# Patient Record
Sex: Female | Born: 1980 | Race: White | Hispanic: No | Marital: Married | Smoking: Never smoker
Health system: Southern US, Community
[De-identification: ages and names within clinical notes are randomized; demographics above are authoritative.]

## PROBLEM LIST (undated history)

## (undated) ENCOUNTER — Inpatient Hospital Stay: Payer: Self-pay

## (undated) DIAGNOSIS — K644 Residual hemorrhoidal skin tags: Secondary | ICD-10-CM

## (undated) DIAGNOSIS — D72819 Decreased white blood cell count, unspecified: Secondary | ICD-10-CM

## (undated) DIAGNOSIS — R011 Cardiac murmur, unspecified: Secondary | ICD-10-CM

## (undated) HISTORY — DX: Cardiac murmur, unspecified: R01.1

## (undated) HISTORY — DX: Decreased white blood cell count, unspecified: D72.819

## (undated) HISTORY — DX: Residual hemorrhoidal skin tags: K64.4

## (undated) HISTORY — PX: WISDOM TOOTH EXTRACTION: SHX21

---
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
## 2005-02-18 ENCOUNTER — Ambulatory Visit: Payer: Self-pay

## 2006-02-19 ENCOUNTER — Ambulatory Visit: Payer: Self-pay | Admitting: Internal Medicine

## 2006-03-10 ENCOUNTER — Ambulatory Visit: Payer: Self-pay | Admitting: Internal Medicine

## 2011-03-27 ENCOUNTER — Ambulatory Visit: Payer: Self-pay | Admitting: Internal Medicine

## 2014-07-10 LAB — OB RESULTS CONSOLE ABO/RH: RH Type: POSITIVE

## 2014-07-10 LAB — OB RESULTS CONSOLE RPR: RPR: NONREACTIVE

## 2014-07-10 LAB — OB RESULTS CONSOLE GC/CHLAMYDIA
CHLAMYDIA, DNA PROBE: NEGATIVE
Gonorrhea: NEGATIVE

## 2014-07-10 LAB — OB RESULTS CONSOLE VARICELLA ZOSTER ANTIBODY, IGG: Varicella: IMMUNE

## 2014-07-10 LAB — OB RESULTS CONSOLE ANTIBODY SCREEN: Antibody Screen: NEGATIVE

## 2014-07-10 LAB — OB RESULTS CONSOLE RUBELLA ANTIBODY, IGM: Rubella: IMMUNE

## 2014-07-10 LAB — OB RESULTS CONSOLE HIV ANTIBODY (ROUTINE TESTING): HIV: NONREACTIVE

## 2014-07-10 LAB — OB RESULTS CONSOLE HEPATITIS B SURFACE ANTIGEN: HEP B S AG: NEGATIVE

## 2014-07-30 ENCOUNTER — Encounter: Payer: Self-pay | Admitting: Maternal & Fetal Medicine

## 2015-01-10 LAB — OB RESULTS CONSOLE GBS: GBS: POSITIVE

## 2015-02-01 ENCOUNTER — Other Ambulatory Visit: Payer: Self-pay | Admitting: Obstetrics and Gynecology

## 2015-02-01 ENCOUNTER — Encounter: Payer: Self-pay | Admitting: Obstetrics and Gynecology

## 2015-02-03 ENCOUNTER — Inpatient Hospital Stay
Admission: EM | Admit: 2015-02-03 | Discharge: 2015-02-08 | DRG: 766 | Disposition: A | Discharge status: Home / Self Care | Payer: BLUE CROSS/BLUE SHIELD | Attending: Obstetrics and Gynecology | Admitting: Obstetrics and Gynecology

## 2015-02-03 DIAGNOSIS — Z3A40 40 weeks gestation of pregnancy: Secondary | ICD-10-CM | POA: Diagnosis present

## 2015-02-03 DIAGNOSIS — O99820 Streptococcus B carrier state complicating pregnancy: Secondary | ICD-10-CM | POA: Diagnosis present

## 2015-02-03 DIAGNOSIS — K644 Residual hemorrhoidal skin tags: Secondary | ICD-10-CM | POA: Diagnosis present

## 2015-02-03 DIAGNOSIS — F419 Anxiety disorder, unspecified: Secondary | ICD-10-CM | POA: Diagnosis present

## 2015-02-03 DIAGNOSIS — N8320 Unspecified ovarian cysts: Secondary | ICD-10-CM | POA: Diagnosis present

## 2015-02-03 DIAGNOSIS — O99344 Other mental disorders complicating childbirth: Secondary | ICD-10-CM | POA: Diagnosis present

## 2015-02-03 DIAGNOSIS — R011 Cardiac murmur, unspecified: Secondary | ICD-10-CM | POA: Diagnosis present

## 2015-02-03 DIAGNOSIS — O133 Gestational [pregnancy-induced] hypertension without significant proteinuria, third trimester: Principal | ICD-10-CM | POA: Diagnosis present

## 2015-02-03 DIAGNOSIS — Z9889 Other specified postprocedural states: Secondary | ICD-10-CM

## 2015-02-03 DIAGNOSIS — Z3490 Encounter for supervision of normal pregnancy, unspecified, unspecified trimester: Secondary | ICD-10-CM

## 2015-02-03 DIAGNOSIS — D72819 Decreased white blood cell count, unspecified: Secondary | ICD-10-CM | POA: Diagnosis present

## 2015-02-03 LAB — CBC
HEMATOCRIT: 36.5 % (ref 35.0–47.0)
HEMOGLOBIN: 12.7 g/dL (ref 12.0–16.0)
MCH: 31.8 pg (ref 26.0–34.0)
MCHC: 34.6 g/dL (ref 32.0–36.0)
MCV: 91.6 fL (ref 80.0–100.0)
PLATELETS: 235 10*3/uL (ref 150–440)
RBC: 3.99 MIL/uL (ref 3.80–5.20)
RDW: 13.4 % (ref 11.5–14.5)
WBC: 8 10*3/uL (ref 3.6–11.0)

## 2015-02-03 LAB — TYPE AND SCREEN
ABO/RH(D): O POS
Antibody Screen: NEGATIVE

## 2015-02-03 MED ORDER — LIDOCAINE HCL (PF) 1 % IJ SOLN
30.0000 mL | INTRAMUSCULAR | Status: DC | PRN
  Filled 2015-02-03: qty 30

## 2015-02-03 MED ORDER — DINOPROSTONE 10 MG VA INST
10.0000 mg | VAGINAL_INSERT | Freq: Once | VAGINAL | Status: AC
  Administered 2015-02-03: 10 mg via VAGINAL
  Filled 2015-02-03: qty 1

## 2015-02-03 MED ORDER — LACTATED RINGERS IV SOLN
500.0000 mL | INTRAVENOUS | Status: DC | PRN
  Administered 2015-02-05: 07:00:00 via INTRAVENOUS

## 2015-02-03 MED ORDER — OXYTOCIN BOLUS FROM INFUSION: 500.0000 mL | INTRAVENOUS | Status: DC

## 2015-02-03 MED ORDER — ACETAMINOPHEN 325 MG PO TABS
650.0000 mg | ORAL_TABLET | ORAL | Status: DC | PRN
  Administered 2015-02-04 (×2): 650 mg via ORAL

## 2015-02-03 MED ORDER — OXYCODONE-ACETAMINOPHEN 5-325 MG PO TABS
1.0000 | ORAL_TABLET | ORAL | Status: DC | PRN
Start: 2015-02-03 — End: 2015-02-05

## 2015-02-03 MED ORDER — OXYCODONE-ACETAMINOPHEN 5-325 MG PO TABS: 2.0000 | ORAL_TABLET | ORAL | Status: DC | PRN

## 2015-02-03 MED ORDER — CITRIC ACID-SODIUM CITRATE 334-500 MG/5ML PO SOLN: 30.0000 mL | ORAL | Status: DC | PRN

## 2015-02-03 MED ORDER — LACTATED RINGERS IV SOLN
INTRAVENOUS | Status: DC
  Administered 2015-02-04: 125 mL/h via INTRAVENOUS
  Administered 2015-02-04 – 2015-02-05 (×2): via INTRAVENOUS

## 2015-02-03 MED ORDER — ONDANSETRON HCL 4 MG/2ML IJ SOLN
4.0000 mg | Freq: Four times a day (QID) | INTRAMUSCULAR | Status: DC | PRN
  Administered 2015-02-05: 4 mg via INTRAVENOUS

## 2015-02-03 MED ORDER — OXYTOCIN 40 UNITS IN LACTATED RINGERS INFUSION - SIMPLE MED
62.5000 mL/h | INTRAVENOUS | Status: DC
  Administered 2015-02-05: 1000 mL via INTRAVENOUS

## 2015-02-04 ENCOUNTER — Inpatient Hospital Stay: Payer: BLUE CROSS/BLUE SHIELD | Admitting: Anesthesiology

## 2015-02-04 LAB — PROTEIN / CREATININE RATIO, URINE
CREATININE, URINE: 51 mg/dL
Protein Creatinine Ratio: 0.33 mg/mg{Cre} — ABNORMAL HIGH (ref 0.00–0.15)
TOTAL PROTEIN, URINE: 17 mg/dL

## 2015-02-04 LAB — ABO/RH: ABO/RH(D): O POS

## 2015-02-04 MED ORDER — OXYTOCIN 40 UNITS IN LACTATED RINGERS INFUSION - SIMPLE MED
1.0000 m[IU]/min | INTRAVENOUS | Status: DC
  Administered 2015-02-04: 22 m[IU]/min via INTRAVENOUS

## 2015-02-04 MED ORDER — SODIUM CHLORIDE 0.9 % IV SOLN
1.0000 g | INTRAVENOUS | Status: DC
  Administered 2015-02-04: 1 g via INTRAVENOUS
  Administered 2015-02-04: 18:00:00 via INTRAVENOUS
  Administered 2015-02-04: 1 g via INTRAVENOUS
  Administered 2015-02-04: 14:00:00 via INTRAVENOUS
  Administered 2015-02-05: 1 g via INTRAVENOUS
  Filled 2015-02-04 (×7): qty 1000

## 2015-02-04 MED ORDER — ACETAMINOPHEN 325 MG PO TABS
ORAL_TABLET | ORAL | Status: AC
  Administered 2015-02-04: 650 mg via ORAL
  Filled 2015-02-04: qty 2

## 2015-02-04 MED ORDER — LIDOCAINE-EPINEPHRINE (PF) 1.5 %-1:200000 IJ SOLN
INTRAMUSCULAR | Status: AC | PRN
  Administered 2015-02-04: 2 mL via PERINEURAL

## 2015-02-04 MED ORDER — TERBUTALINE SULFATE 1 MG/ML IJ SOLN: 0.2500 mg | Freq: Once | INTRAMUSCULAR | Status: AC | PRN

## 2015-02-04 MED ORDER — AMPICILLIN SODIUM 1 G IJ SOLR
INTRAMUSCULAR | Status: AC
  Administered 2015-02-04: 1 g via INTRAVENOUS
  Filled 2015-02-04: qty 1000

## 2015-02-04 MED ORDER — OXYTOCIN 40 UNITS IN LACTATED RINGERS INFUSION - SIMPLE MED
1.0000 m[IU]/min | INTRAVENOUS | Status: DC
  Administered 2015-02-04: 1 m[IU]/min via INTRAVENOUS
  Administered 2015-02-04: 18 m[IU]/min via INTRAVENOUS
  Administered 2015-02-04: 10 m[IU]/min via INTRAVENOUS
  Administered 2015-02-04: 14 m[IU]/min via INTRAVENOUS
  Administered 2015-02-04: 16 m[IU]/min via INTRAVENOUS
  Administered 2015-02-04: 12 m[IU]/min via INTRAVENOUS
  Administered 2015-02-04: 6 m[IU]/min via INTRAVENOUS
  Administered 2015-02-04 (×2): 20 m[IU]/min via INTRAVENOUS
  Administered 2015-02-04: 8 m[IU]/min via INTRAVENOUS
  Administered 2015-02-04: 2 m[IU]/min via INTRAVENOUS

## 2015-02-04 MED ORDER — SODIUM CHLORIDE 0.9 % IV SOLN
INTRAVENOUS | Status: AC
  Filled 2015-02-04: qty 1000

## 2015-02-04 MED ORDER — SODIUM CHLORIDE 0.9 % IV SOLN
2.0000 g | INTRAVENOUS | Status: DC
  Administered 2015-02-04: 2 g via INTRAVENOUS

## 2015-02-04 MED ORDER — SODIUM CHLORIDE 0.9 % IJ SOLN
INTRAMUSCULAR | Status: AC
  Filled 2015-02-04: qty 50

## 2015-02-04 MED ORDER — BUTORPHANOL TARTRATE 1 MG/ML IJ SOLN
2.0000 mg | INTRAMUSCULAR | Status: DC | PRN
  Administered 2015-02-04: 1 mg via INTRAVENOUS

## 2015-02-04 MED ORDER — AMMONIA AROMATIC IN INHA
RESPIRATORY_TRACT | Status: AC
  Filled 2015-02-04: qty 10

## 2015-02-04 MED ORDER — MISOPROSTOL 200 MCG PO TABS
ORAL_TABLET | ORAL | Status: AC
  Filled 2015-02-04: qty 4

## 2015-02-04 MED ORDER — OXYTOCIN 40 UNITS IN LACTATED RINGERS INFUSION - SIMPLE MED
INTRAVENOUS | Status: AC
  Filled 2015-02-04: qty 1000

## 2015-02-04 MED ORDER — OXYTOCIN 10 UNIT/ML IJ SOLN
INTRAMUSCULAR | Status: AC
  Filled 2015-02-04: qty 2

## 2015-02-04 MED ORDER — SODIUM CHLORIDE 0.9 % IV SOLN
INTRAVENOUS | Status: AC
  Administered 2015-02-04: 2 g via INTRAVENOUS
  Filled 2015-02-04: qty 2000

## 2015-02-04 MED ORDER — BUTORPHANOL TARTRATE 1 MG/ML IJ SOLN: 1.0000 mg | INTRAMUSCULAR | Status: DC | PRN

## 2015-02-04 MED ORDER — LIDOCAINE HCL (PF) 1 % IJ SOLN
INTRAMUSCULAR | Status: AC
  Filled 2015-02-04: qty 30

## 2015-02-04 MED ORDER — FENTANYL 2.5 MCG/ML W/ROPIVACAINE 0.2% IN NS 100 ML EPIDURAL INFUSION (ARMC-ANES)
EPIDURAL | Status: AC
  Administered 2015-02-04: 11 mL/h via EPIDURAL
  Filled 2015-02-04: qty 100

## 2015-02-04 MED ORDER — BUTORPHANOL TARTRATE 1 MG/ML IJ SOLN
INTRAMUSCULAR | Status: AC
  Administered 2015-02-04: 1 mg via INTRAVENOUS
  Filled 2015-02-04: qty 1

## 2015-02-04 MED ORDER — BUPIVACAINE HCL (PF) 0.25 % IJ SOLN
INTRAMUSCULAR | Status: AC | PRN
  Administered 2015-02-04: 2 mL via PERINEURAL
  Administered 2015-02-04: 5 mL via PERINEURAL

## 2015-02-04 NOTE — H&P (Signed)
Gildardo CrankerSara N Brunner is a 34 y.o. female presenting for IOL due to Gest HTN with no proteinuria. LMP of 02/04/15 & ED[facility] 02/04/15 [facility] for Anxiety, Gest HTN and anemia, Ext hemorrhoids. No ROM, No VB, FHR 145. Pt had Cervidil last pm but, no cx change: 1/long/vtx-2 Maternal Medical History:  Fetal activity: Perceived fetal activity is normal.    Prenatal complications: PIH.   No preterm labor.     OB History    Gravida Para Term Preterm AB TAB SAB Ectopic Multiple Living   2 1 1  0 0 0 0 0 0 0      Obstetric Comments   Gestational HTN without proteinuria and tx without meds     Past Medical History  Diagnosis Date  . Indication for care in labor or delivery   . External hemorrhoids   . Heart murmur   . Leukopenia    Past Surgical History  Procedure Laterality Date  . Wisdom tooth extraction     Family History: family history includes Cancer in her maternal grandfather and maternal grandmother; Heart failure in her paternal grandfather; Hernia in her paternal grandfather. Social History:  reports that she has never smoked. She has never used smokeless tobacco. She reports that she does not drink alcohol or use illicit drugs.   Prenatal Transfer Tool  Maternal Diabetes: No Genetic Screening: Normal Maternal Ultrasounds/Referrals: Normal Fetal Ultrasounds or other Referrals:  None Maternal Substance Abuse:  No Significant Maternal Medications:  None Significant Maternal Lab Results:  None Other Comments:  None  ROS  Dilation: 1 Effacement (%): Thick Station: -3 Exam by:: CGD Blood pressure 129/80, pulse 85, temperature 98.5 F (36.9 C), temperature source Oral, resp. rate 18, height 5\' 8"  (1.727 m), weight 107.049 kg (236 lb), last menstrual period 04/30/2014, unknown if currently breastfeeding. Exam Physical Exam  Prenatal labs: ABO, Rh: --/--/O POS (06/26 2103) Antibody: NEG (06/26 2103) Rubella: Immune (12/01 1400) RPR: Nonreactive (12/01 1400)   HBsAg: Negative (12/01 1400)  HIV: Non-reactive (12/01 1400)  GBS: Positive (06/02 0900)   Assessment/Plan: A: 1. IUP at 40 weeks with Gest HTN without proteinuria 2. Anxiety P: Foley bulb after Cervidil last pm and then Pitocin drip 2. AROM with internal monitoring when able 3. EFW: 8#9. 4. Plans epidural for pain management. 5. Continue to monitor UC/FHT's Sharee PimpleJONES, CARON W 02/04/2015, 8:31 AM

## 2015-02-04 NOTE — Progress Notes (Signed)
Sabrina Freeman is a 34 y.o. G2P1000 at [redacted]w[redacted]d by LMP admitted for induction of labor due to Hypertension.  Subjective:   Objective: BP 129/80 mmHg  Pulse 85  Temp(Src) 98.5 F (36.9 C) (Oral)  Resp 18  Ht 5\' 8"  (1.727 m)  Wt 107.049 kg (236 lb)  BMI 35.89 kg/m2  LMP 04/30/2014  Breastfeeding? Unknown      FHT:  FHR: 145, no decels, +accels, Cat 1 bpm, variability: minimal ,  accelerations:  Present,  decelerations:  Absent UC:   regular, every 2 minutes, mild, Foley bulb fell out while in BR SVE:   Dilation: 1 Effacement (%): Thick Station: -3 Exam by:: CGD  Labs: Lab Results  Component Value Date   WBC 8.0 02/03/2015   HGB 12.7 02/03/2015   HCT 36.5 02/03/2015   MCV 91.6 02/03/2015   PLT 235 02/03/2015    Assessment / Plan: Induction of labor due to gestational hypertension,  progressing well on pitocin  Labor: Progressing on Pitocin, will continue to increase then AROM Preeclampsia:  none detected Fetal Wellbeing:  Category I Pain Control:  Labor support without medications I/D:  n/a Anticipated MOD:  NSVD  [name] 02/04/2015, 12:02 PM

## 2015-02-04 NOTE — Progress Notes (Signed)
Patient ID: Sabrina CrankerSara N Kuznicki, female   DOB: 14-Nov-1980, 34 y.o.   MRN: [id number]  No HA, Blurred vision, spots in front of eyes or RUQ pain. PLTs 235. Will continue to monitor UC/FHT's. Edema 2+ lower exrems. Sitting up in chair at bedside.

## 2015-02-04 NOTE — Progress Notes (Signed)
Sabrina Freeman is a 34 y.o. G2P1000 at [redacted]w[redacted]d by LMP admitted for active labor  Subjective: Doing well  Objective: BP 79/43 mmHg  Pulse 70  Temp(Src) 98.5 F (36.9 C) (Oral)  Resp 20  Ht 5\' 8"  (1.727 m)  Wt 107.049 kg (236 lb)  BMI 35.89 kg/m2  SpO2 99%  LMP 04/30/2014  Breastfeeding? Unknown I/O last 3 completed shifts: In: 300 [I.V.:300] Out: -  Total I/O In: 750 [I.V.:750] Out: -   FHT:  FHR: 160, mod variability, 3 decels which corrected and now improved bpm, variability: moderate,  accelerations:  Present,  decelerations:  Absent UC:   regular, every 2-3  minutes SVE:   Dilation: 4 Effacement (%): 80 Station: -1, -2 Exam by:: c.Taino Maertens cnm  Labs: Lab Results  Component Value Date   WBC 8.0 02/03/2015   HGB 12.7 02/03/2015   HCT 36.5 02/03/2015   MCV 91.6 02/03/2015   PLT 235 02/03/2015    Assessment / Plan: Induction of labor due to gestational hypertension,  progressing well on pitocin  Labor: Pitocin at 2 mun/min Preeclampsia:  none Fetal Wellbeing:  Category II Pain Control:  Epidural I/D:  n/a Anticipated MOD:  NSVD  [name] 02/04/2015, 11:30 PM

## 2015-02-04 NOTE — Anesthesia Procedure Notes (Signed)
Epidural Patient location during procedure: OB Start time: 02/04/2015 10:34 PM End time: 02/04/2015 10:51 PM  Staffing Anesthesiologist: Elijio MilesVAN STAVEREN, Trapper Meech F  Preanesthetic Checklist Completed: patient identified, site marked, surgical consent, pre-op evaluation, timeout performed, IV checked, risks and benefits discussed and monitors and equipment checked  Epidural Patient position: sitting Prep: Betadine Patient monitoring: heart rate and blood pressure Approach: midline Location: L3-L4 Injection technique: LOR air and LOR saline  Needle:  Needle type: Tuohy  Needle gauge: 18 G Needle length: 9 cm Needle insertion depth: 7 cm Catheter type: closed end flexible Catheter size: 20 Guage Catheter at skin depth: 8 cm Test dose: negative and 1.5% lidocaine with Epi 1:200 K  Assessment Sensory level: T8

## 2015-02-04 NOTE — Progress Notes (Signed)
Sabrina Freeman is a 34 y.o. G2P1000 at [redacted]w[redacted]d by LMP admitted for induction of labor due to Hypertension.  Subjective: Hungry and wants to have some breakfast  Objective: BP 129/80 mmHg  Pulse 85  Temp(Src) 98.5 F (36.9 C) (Oral)  Resp 18  Ht 5\' 8"  (1.727 m)  Wt 107.049 kg (236 lb)  BMI 35.89 kg/m2  LMP 04/30/2014  Breastfeeding? Unknown      FHT:  FHR: 145, +accels, no decels, mod variabiltiy, Cat I strip bpm, variability: moderate,  accelerations:  Present,  decelerations:  Absent UC:   none SVE:   Dilation: 1 Effacement (%): Thick Station: -3 Exam by:: CGD  Labs: Lab Results  Component Value Date   WBC 8.0 02/03/2015   HGB 12.7 02/03/2015   HCT 36.5 02/03/2015   MCV 91.6 02/03/2015   PLT 235 02/03/2015    Assessment / Plan: Induction of labor due to gestational hypertension,  progressing well on pitocin  Foley bulb was placed and 30 cc balloon with mechanical traction to the cx. Cervidil was removed prior to exam. Cx is 1/20%/vtx-2  Labor: Progressing on Pitocin, will continue to increase then AROM Preeclampsia:  no proteinuria, BP is stable at present.  Fetal Wellbeing:  Category I Pain Control:  Labor support without medications I/D:  n/a Anticipated MOD:  NSVD  [name] 02/04/2015, 8:53 AM

## 2015-02-04 NOTE — Progress Notes (Signed)
Gildardo CrankerSara N Fullard is a 34 y.o. G2P1000 at 1055w6d by LMP admitted for induction of labor due to Gestational HTN.   Subjective: Pt is upset as she has not made any progress and feerls that she should have by now. She has been talking about  A C-section as her whole family has had C-sections.   Objective: BP 132/77 mmHg  Pulse 85  Temp(Src) 98.3 F (36.8 C) (Oral)  Resp 18  Ht 5\' 8"  (1.727 m)  Wt 107.049 kg (236 lb)  BMI 35.89 kg/m2  LMP 04/30/2014  Breastfeeding? Unknown I/O last 3 completed shifts: In: 300 [I.V.:300] Out: -     FHT:  155, no further decels, O2 is now off. Pt upset as RN ran in and placed O@ and this upset the pt. She is tired and upset as she has not made progress. FHR is stable.  UC:   regular, every 2-3  minutes SVE:   Dilation: 3 Effacement (%): 80 Station: -1, -2 Exam by:: CGD  Labs: Lab Results  Component Value Date   WBC 8.0 02/03/2015   HGB 12.7 02/03/2015   HCT 36.5 02/03/2015   MCV 91.6 02/03/2015   PLT 235 02/03/2015    Assessment / Plan: Induction of labor due to gest htn,  progressing well on pitocin  Labor: Progressing normally Preeclampsia:  none Fetal Wellbeing:  Category II Pain Control:  Fentanyl I/D:  n/a Anticipated MOD:  NSVD  Sharee PimpleJONES, Kerrion Kemppainen W 02/04/2015, 8:31 PM

## 2015-02-04 NOTE — Progress Notes (Signed)
Sabrina Freeman is a 34 y.o. G2P1000 at 106w6d by LMP admitted for induction of labor due to Hypertension.  Subjective: Feels cramping  Objective: BP 118/73 mmHg  Pulse 81  Temp(Src) 98.5 F (36.9 C) (Oral)  Resp 18  Ht 5\' 8"  (1.727 m)  Wt 107.049 kg (236 lb)  BMI 35.89 kg/m2  LMP 04/30/2014  Breastfeeding? Unknown      FHT:  FHR: 145, Cat I, +accels, no decels bpm, variability: moderate,  accelerations:  Present,  decelerations:  Absent UC:   regular, every  minutes SVE:   Dilation: 1 Effacement (%): Thick Station: -3 Exam by:: CGD  Labs: Lab Results  Component Value Date   WBC 8.0 02/03/2015   HGB 12.7 02/03/2015   HCT 36.5 02/03/2015   MCV 91.6 02/03/2015   PLT 235 02/03/2015    Assessment / Plan: Induction of labor due to gestational hypertension,  progressing well on pitocin  Labor: Progressing normally Preeclampsia:  none Fetal Wellbeing:  Category I Pain Control:  Labor support without medications I/D:  n/a Anticipated MOD:  NSVD  Sharee PimpleJONES, CARON W 02/04/2015, 1:48 PM

## 2015-02-04 NOTE — Anesthesia Preprocedure Evaluation (Signed)
Anesthesia Evaluation  Patient identified by MRN, date of birth, ID band Patient awake    Reviewed: Allergy & Precautions, NPO status   History of Anesthesia Complications Negative for: history of anesthetic complications  Airway Mallampati: II       Dental   Pulmonary neg pulmonary ROS,    Pulmonary exam normal       Cardiovascular negative cardio ROS Normal cardiovascular exam    Neuro/Psych negative neurological ROS  negative psych ROS   GI/Hepatic negative GI ROS, Neg liver ROS,   Endo/Other  negative endocrine ROS  Renal/GU negative Renal ROS  negative genitourinary   Musculoskeletal negative musculoskeletal ROS (+)   Abdominal Normal abdominal exam  (+)   Peds negative pediatric ROS (+)  Hematology negative hematology ROS (+)   Anesthesia Other Findings   Reproductive/Obstetrics negative OB ROS                             Anesthesia Physical Anesthesia Plan  ASA: II  Anesthesia Plan: Epidural   Post-op Pain Management:    Induction:   Airway Management Planned:   Additional Equipment:   Intra-op Plan:   Post-operative Plan:   Informed Consent: I have reviewed the patients History and Physical, chart, labs and discussed the procedure including the risks, benefits and alternatives for the proposed anesthesia with the patient or authorized representative who has indicated his/her understanding and acceptance.     Plan Discussed with:   Anesthesia Plan Comments:         Anesthesia Quick Evaluation

## 2015-02-05 ENCOUNTER — Inpatient Hospital Stay: Payer: BLUE CROSS/BLUE SHIELD | Admitting: Anesthesiology

## 2015-02-05 ENCOUNTER — Encounter
Admission: EM | Disposition: A | Discharge status: Home / Self Care | Payer: Self-pay | Source: Home / Self Care | Attending: Obstetrics and Gynecology

## 2015-02-05 DIAGNOSIS — Z9889 Other specified postprocedural states: Secondary | ICD-10-CM

## 2015-02-05 HISTORY — PX: OOPHORECTOMY: SHX6387

## 2015-02-05 LAB — RPR: RPR: NONREACTIVE

## 2015-02-05 SURGERY — Surgical Case
Wound class: Clean Contaminated

## 2015-02-05 SURGERY — Surgical Case
Anesthesia: Regional | Site: Abdomen

## 2015-02-05 MED ORDER — SIMETHICONE 80 MG PO CHEW
80.0000 mg | CHEWABLE_TABLET | ORAL | Status: DC | PRN
  Administered 2015-02-07: 80 mg via ORAL

## 2015-02-05 MED ORDER — DIBUCAINE 1 % RE OINT: 1.0000 "application " | TOPICAL_OINTMENT | RECTAL | Status: DC | PRN

## 2015-02-05 MED ORDER — FENTANYL CITRATE (PF) 100 MCG/2ML IJ SOLN
INTRAMUSCULAR | Status: AC
  Administered 2015-02-05: 25 ug via INTRAVENOUS
  Filled 2015-02-05: qty 2

## 2015-02-05 MED ORDER — OXYCODONE-ACETAMINOPHEN 5-325 MG PO TABS
1.0000 | ORAL_TABLET | ORAL | Status: DC | PRN
  Administered 2015-02-06 – 2015-02-08 (×4): 1 via ORAL
  Filled 2015-02-05 (×4): qty 1

## 2015-02-05 MED ORDER — IBUPROFEN 600 MG PO TABS
600.0000 mg | ORAL_TABLET | Freq: Four times a day (QID) | ORAL | Status: DC | PRN
  Administered 2015-02-05: 600 mg via ORAL
  Filled 2015-02-05 (×4): qty 1

## 2015-02-05 MED ORDER — CITRIC ACID-SODIUM CITRATE 334-500 MG/5ML PO SOLN: 30.0000 mL | Freq: Once | ORAL | Status: DC

## 2015-02-05 MED ORDER — BUPIVACAINE HCL (PF) 0.5 % IJ SOLN
INTRAMUSCULAR | Status: AC
  Filled 2015-02-05: qty 30

## 2015-02-05 MED ORDER — WITCH HAZEL-GLYCERIN EX PADS: 1.0000 "application " | MEDICATED_PAD | CUTANEOUS | Status: DC | PRN

## 2015-02-05 MED ORDER — LACTATED RINGERS IV SOLN
INTRAVENOUS | Status: DC
  Administered 2015-02-06: 03:00:00 via INTRAVENOUS

## 2015-02-05 MED ORDER — OXYCODONE-ACETAMINOPHEN 5-325 MG PO TABS
2.0000 | ORAL_TABLET | ORAL | Status: DC | PRN
  Administered 2015-02-06 – 2015-02-07 (×4): 2 via ORAL
  Filled 2015-02-05 (×4): qty 2

## 2015-02-05 MED ORDER — SIMETHICONE 80 MG PO CHEW
80.0000 mg | CHEWABLE_TABLET | Freq: Three times a day (TID) | ORAL | Status: DC
  Administered 2015-02-05 – 2015-02-08 (×9): 80 mg via ORAL
  Filled 2015-02-05 (×12): qty 1

## 2015-02-05 MED ORDER — MEPERIDINE HCL 25 MG/ML IJ SOLN
6.2500 mg | INTRAMUSCULAR | Status: AC | PRN
  Administered 2015-02-05 (×2): 6.25 mg via INTRAVENOUS

## 2015-02-05 MED ORDER — DIPHENHYDRAMINE HCL 25 MG PO CAPS: 25.0000 mg | ORAL_CAPSULE | Freq: Four times a day (QID) | ORAL | Status: DC | PRN

## 2015-02-05 MED ORDER — SIMETHICONE 80 MG PO CHEW
80.0000 mg | CHEWABLE_TABLET | ORAL | Status: DC
  Administered 2015-02-06 – 2015-02-08 (×3): 80 mg via ORAL
  Filled 2015-02-05: qty 1

## 2015-02-05 MED ORDER — ZOLPIDEM TARTRATE 5 MG PO TABS: 5.0000 mg | ORAL_TABLET | Freq: Every evening | ORAL | Status: DC | PRN

## 2015-02-05 MED ORDER — DIPHENHYDRAMINE HCL 25 MG PO CAPS: 25.0000 mg | ORAL_CAPSULE | ORAL | Status: DC | PRN

## 2015-02-05 MED ORDER — TETANUS-DIPHTH-ACELL PERTUSSIS 5-2.5-18.5 LF-MCG/0.5 IM SUSP: 0.5000 mL | Freq: Once | INTRAMUSCULAR | Status: DC

## 2015-02-05 MED ORDER — PRENATAL MULTIVITAMIN CH
1.0000 | ORAL_TABLET | Freq: Every day | ORAL | Status: DC
  Administered 2015-02-05 – 2015-02-07 (×3): 1 via ORAL
  Filled 2015-02-05 (×3): qty 1

## 2015-02-05 MED ORDER — MENTHOL 3 MG MT LOZG: 1.0000 | LOZENGE | OROMUCOSAL | Status: DC | PRN

## 2015-02-05 MED ORDER — CEFAZOLIN SODIUM-DEXTROSE 2-3 GM-% IV SOLR: 2.0000 g | Freq: Once | INTRAVENOUS | Status: DC

## 2015-02-05 MED ORDER — FENTANYL CITRATE (PF) 100 MCG/2ML IJ SOLN
25.0000 ug | INTRAMUSCULAR | Status: DC | PRN
  Administered 2015-02-05 (×4): 25 ug via INTRAVENOUS

## 2015-02-05 MED ORDER — SODIUM CHLORIDE 0.9 % IJ SOLN: 3.0000 mL | INTRAMUSCULAR | Status: DC | PRN

## 2015-02-05 MED ORDER — CITRIC ACID-SODIUM CITRATE 334-500 MG/5ML PO SOLN
ORAL | Status: AC
  Filled 2015-02-05: qty 15

## 2015-02-05 MED ORDER — NALOXONE HCL 0.4 MG/ML IJ SOLN
0.4000 mg | INTRAMUSCULAR | Status: DC | PRN
Start: 2015-02-05 — End: 2015-02-08

## 2015-02-05 MED ORDER — IBUPROFEN 600 MG PO TABS
600.0000 mg | ORAL_TABLET | Freq: Four times a day (QID) | ORAL | Status: DC
  Administered 2015-02-05 – 2015-02-08 (×11): 600 mg via ORAL
  Filled 2015-02-05 (×8): qty 1

## 2015-02-05 MED ORDER — OXYTOCIN 40 UNITS IN LACTATED RINGERS INFUSION - SIMPLE MED
62.5000 mL/h | INTRAVENOUS | Status: AC
  Administered 2015-02-05: 62.5 mL/h via INTRAVENOUS
  Filled 2015-02-05: qty 1000

## 2015-02-05 MED ORDER — MEPERIDINE HCL 25 MG/ML IJ SOLN
INTRAMUSCULAR | Status: AC
  Administered 2015-02-05: 6.25 mg via INTRAVENOUS
  Filled 2015-02-05: qty 1

## 2015-02-05 MED ORDER — ONDANSETRON HCL 4 MG/2ML IJ SOLN: 4.0000 mg | Freq: Once | INTRAMUSCULAR | Status: DC | PRN

## 2015-02-05 MED ORDER — DIPHENHYDRAMINE HCL 50 MG/ML IJ SOLN: 12.5000 mg | INTRAMUSCULAR | Status: DC | PRN

## 2015-02-05 MED ORDER — SCOPOLAMINE 1 MG/3DAYS TD PT72
1.0000 | MEDICATED_PATCH | Freq: Once | TRANSDERMAL | Status: DC
  Filled 2015-02-05: qty 1

## 2015-02-05 MED ORDER — LANOLIN HYDROUS EX OINT: 1.0000 "application " | TOPICAL_OINTMENT | CUTANEOUS | Status: DC | PRN

## 2015-02-05 MED ORDER — NALOXONE HCL 1 MG/ML IJ SOLN: 1.0000 ug/kg/h | INTRAVENOUS | Status: DC | PRN

## 2015-02-05 MED ORDER — ONDANSETRON HCL 4 MG/2ML IJ SOLN: 4.0000 mg | Freq: Three times a day (TID) | INTRAMUSCULAR | Status: DC | PRN

## 2015-02-05 MED ORDER — MORPHINE SULFATE (PF) 0.5 MG/ML IJ SOLN
INTRAMUSCULAR | Status: DC | PRN
  Administered 2015-02-05: 2 mg via EPIDURAL

## 2015-02-05 MED ORDER — CEFAZOLIN SODIUM-DEXTROSE 2-3 GM-% IV SOLR
INTRAVENOUS | Status: AC
  Administered 2015-02-05: 2 g via INTRAVENOUS
  Filled 2015-02-05: qty 50

## 2015-02-05 MED ORDER — LIDOCAINE HCL (PF) 2 % IJ SOLN
INTRAMUSCULAR | Status: DC | PRN
  Administered 2015-02-05: 18 mg via INTRADERMAL

## 2015-02-05 MED ORDER — SENNOSIDES-DOCUSATE SODIUM 8.6-50 MG PO TABS
2.0000 | ORAL_TABLET | ORAL | Status: DC
  Administered 2015-02-06 – 2015-02-08 (×3): 2 via ORAL
  Filled 2015-02-05 (×3): qty 2

## 2015-02-05 MED ORDER — ACETAMINOPHEN 325 MG PO TABS: 650.0000 mg | ORAL_TABLET | ORAL | Status: DC | PRN

## 2015-02-05 MED ORDER — PHENYLEPHRINE HCL 10 MG/ML IJ SOLN
INTRAMUSCULAR | Status: DC | PRN
  Administered 2015-02-05 (×2): 100 ug via INTRAVENOUS

## 2015-02-05 MED ORDER — CHLORHEXIDINE GLUCONATE CLOTH 2 % EX PADS
6.0000 | MEDICATED_PAD | Freq: Every day | CUTANEOUS | Status: DC
  Administered 2015-02-05: 6 via TOPICAL

## 2015-02-05 MED ORDER — SODIUM CHLORIDE 0.9 % IV SOLN
INTRAVENOUS | Status: AC
  Administered 2015-02-05: 1 g via INTRAVENOUS
  Filled 2015-02-05: qty 1000

## 2015-02-05 SURGICAL SUPPLY — 22 items
BARRIER ADHS 3X4 INTERCEED (GAUZE/BANDAGES/DRESSINGS) ×4 IMPLANT
CANISTER SUCT 3000ML (MISCELLANEOUS) ×4 IMPLANT
CHLORAPREP W/TINT 26ML (MISCELLANEOUS) ×4 IMPLANT
DRSG TELFA 3X8 NADH (GAUZE/BANDAGES/DRESSINGS) ×4 IMPLANT
ELECT CAUTERY BLADE 6.4 (BLADE) ×4 IMPLANT
GAUZE SPONGE 4X4 12PLY STRL (GAUZE/BANDAGES/DRESSINGS) ×4 IMPLANT
GLOVE BIO SURGEON STRL SZ8 (GLOVE) ×4 IMPLANT
GOWN STRL REUS W/ TWL LRG LVL3 (GOWN DISPOSABLE) ×4 IMPLANT
GOWN STRL REUS W/ TWL XL LVL3 (GOWN DISPOSABLE) ×2 IMPLANT
GOWN STRL REUS W/TWL LRG LVL3 (GOWN DISPOSABLE) ×4
GOWN STRL REUS W/TWL XL LVL3 (GOWN DISPOSABLE) ×2
NS IRRIG 1000ML POUR BTL (IV SOLUTION) ×4 IMPLANT
PACK C SECTION AR (MISCELLANEOUS) ×4 IMPLANT
PAD GROUND ADULT SPLIT (MISCELLANEOUS) ×4 IMPLANT
PAD OB MATERNITY 4.3X12.25 (PERSONAL CARE ITEMS) ×4 IMPLANT
PAD PREP 24X41 OB/GYN DISP (PERSONAL CARE ITEMS) ×4 IMPLANT
SPONGE LAP 18X18 5 PK (GAUZE/BANDAGES/DRESSINGS) ×16 IMPLANT
STAPLER INSORB 30 2030 C-SECTI (MISCELLANEOUS) ×4 IMPLANT
STRAP SAFETY BODY (MISCELLANEOUS) ×4 IMPLANT
SUT CHROMIC 1 CTX 36 (SUTURE) ×12 IMPLANT
SUT PLAIN GUT 0 (SUTURE) ×8 IMPLANT
SUT VIC AB 0 CT1 36 (SUTURE) ×8 IMPLANT

## 2015-02-05 SURGICAL SUPPLY — 26 items
BARRIER ADHS 3X4 INTERCEED (GAUZE/BANDAGES/DRESSINGS) ×3 IMPLANT
CANISTER SUCT 3000ML (MISCELLANEOUS) ×3 IMPLANT
CATH KIT ON-Q SILVERSOAK 5IN (CATHETERS) ×6 IMPLANT
CHLORAPREP W/TINT 26ML (MISCELLANEOUS) ×3 IMPLANT
DRSG TELFA 3X8 NADH (GAUZE/BANDAGES/DRESSINGS) ×3 IMPLANT
ELECT CAUTERY BLADE 6.4 (BLADE) ×3 IMPLANT
GAUZE SPONGE 4X4 12PLY STRL (GAUZE/BANDAGES/DRESSINGS) ×3 IMPLANT
GLOVE BIO SURGEON STRL SZ8 (GLOVE) ×3 IMPLANT
GOWN STRL REUS W/ TWL LRG LVL3 (GOWN DISPOSABLE) ×2 IMPLANT
GOWN STRL REUS W/ TWL XL LVL3 (GOWN DISPOSABLE) ×1 IMPLANT
GOWN STRL REUS W/TWL LRG LVL3 (GOWN DISPOSABLE) ×4
GOWN STRL REUS W/TWL XL LVL3 (GOWN DISPOSABLE) ×2
NS IRRIG 1000ML POUR BTL (IV SOLUTION) ×3 IMPLANT
PACK C SECTION AR (MISCELLANEOUS) ×3 IMPLANT
PAD GROUND ADULT SPLIT (MISCELLANEOUS) ×3 IMPLANT
PAD OB MATERNITY 4.3X12.25 (PERSONAL CARE ITEMS) ×3 IMPLANT
PAD PREP 24X41 OB/GYN DISP (PERSONAL CARE ITEMS) ×3 IMPLANT
SPONGE LAP 18X18 5 PK (GAUZE/BANDAGES/DRESSINGS) ×12 IMPLANT
STAPLER INSORB 30 2030 C-SECTI (MISCELLANEOUS) ×3 IMPLANT
STRAP SAFETY BODY (MISCELLANEOUS) ×3 IMPLANT
SUT CHROMIC 1 CTX 36 (SUTURE) ×9 IMPLANT
SUT CHROMIC 2 0 CT 1 (SUTURE) ×3 IMPLANT
SUT PLAIN GUT 0 (SUTURE) ×6 IMPLANT
SUT VIC AB 0 CT1 36 (SUTURE) ×6 IMPLANT
SUT VIC AB 3-0 SH 27 (SUTURE) ×2
SUT VIC AB 3-0 SH 27X BRD (SUTURE) ×1 IMPLANT

## 2015-02-05 NOTE — Op Note (Signed)
Sabrina Freeman, Sabrina Freeman NO.:  [id number]  MEDICAL RECORD NO.:  [id number]  LOCATION:  LDR5                         FACILITY:  [facility]  PHYSICIAN:  Jennell Corner, MDDATE OF BIRTH:  1980-08-23  DATE OF PROCEDURE:  02/05/2015 DATE OF DISCHARGE:                              OPERATIVE REPORT   PREOPERATIVE DIAGNOSIS: 1. A 40+ 1-week estimated gestation. 2. Active phase arrest.  POSTOPERATIVE DIAGNOSIS: 1. A 40+ 1-week gestation. 2. Active phase arrest. 3. Complex left ovarian cyst.  PROCEDURE: 1. Primary low transverse cesarean section. 2. Left oophorectomy.  ANESTHESIA:  Surgical dosing of continuous lumbar epidural.  SURGEON:  Jennell Corner, MD  FIRST ASSISTANT:  Marya Fossa; certified nurse midwife.  INDICATIONS:  A 34 year old, gravida 1, para 0, at 40+ 0 weeks when she was admitted for induction for history of gestational hypertension.  The patient underwent a Cervidil induction; and on induction day #2, progressed to early active labor and labor throughout the day and did not make adequate progression past 5 cm despite Pitocin and an epidural.  FINDINGS:  Thick meconium, vigorous female, and complex left ovarian cyst consistent with a dermoid cyst.  DESCRIPTION OF PROCEDURE:  After adequate surgical dosing of continuous lumbar epidural, the patient was placed in dorsal supine position.  The patient did experience some hypertension during dosing of the epidural, please reference anesthesiologist note for this.  Fetal heart rate was transiently low during this episode with a documented fetal heart rate of 78.  The patient was promptly prepped and draped while a timeout was performed.  A Pfannenstiel incision was made 2 fingerbreadths above the symphysis pubis.  Sharp dissection was used to identify the fascia. Fascia was opened in the midline and opened in a transverse fashion. The superior aspect of the fascia was grasped with Kocher  clamps, and the recti muscles were dissected free.  The inferior aspect of the fascia was grasped with Kocher clamps, and pyramidalis muscles were dissected free.  The peritoneal cavity was opened sharply.  The vesicular uterine peritoneal fold was identified and opened and a bladder flap was created, and the bladder was reflected inferiorly.  Low transverse uterine incision was made.  Upon entry into the endometrial cavity, a thick meconium was noted.  The incision was extended with blunt transverse traction.  Fetal head with molding was brought through the incision followed by the shoulders and the rest of the body.  The infant's cord was doubly clamped, and infant was passed to the [employer] who assigned Apgar scores of 2 and 9, infant weighed 9 pounds 4 ounces.  Cord blood was collected and the placenta was delivered, and the uterus was exteriorized and the endometrial cavity was wiped clean with laparotomy tape.  The uterine incision was closed with 1 chromic suture in a running, locking fashion.  Two additional figure-of-eight sutures required for good hemostasis.  Of note, there was a complex left ovarian cyst that was noted with the coloration and consistency of a dermoid cyst.  During procedure, discussed this with the patient and the husband and recommended removal of the ovary given the appearance.  Both the patient and the husband agreed, and  the ovary was removed by placing 2 large Kelly clamps and the pedicle was sutured with 0 Vicryl suture. Good hemostasis noted.  The posterior cul-de-sac was irrigated and suctioned, and the uterus was placed back into the abdominal cavity. The oophorectomy site appeared hemostatic after cleaning both the pericolic gutters.  The uterine incision appeared hemostatic, and Interceed was placed over this in a T-shaped fashion.  Of note, the patient's right ovary and fallopian tube appeared normal.  The fascia was then closed with 0 Vicryl  in a running, nonlocking fashion.  Good approximation of the edges.  Subcutaneous tissues were irrigated and bovied.  The subcutaneous tissue depth was approximately 4 cm. Therefore, the subcu tissues were closed with interrupted 2-0 chromic suture.  The skin was reapproximated with Insorb staples with good cosmetic effect.  There were no complications.  Estimated blood loss 800 mL.  Intraoperative fluids 1400 mL.  Urine output 600 mL.  The patient tolerated the procedure well and was taken to the recovery room in good condition.          ______________________________ Jennell Cornerhomas Roswell Ndiaye, MD     TS/MEDQ  D:  02/05/2015  T:  02/05/2015  Job:  [id number]

## 2015-02-05 NOTE — Brief Op Note (Signed)
02/03/2015 - 02/05/2015  7:48 AM  PATIENT:  Sabrina Freeman  34 y.o. female  PRE-OPERATIVE DIAGNOSIS:  non-reassuring fetal heart tones, arrested descent  POST-OPERATIVE DIAGNOSIS: Active phase arrest,  Complex left ovarian cyst PROCEDURE:  Procedure(s): CESAREAN SECTION (N/A)  Left oophorectomy  SURGEON:  Surgeon(s) and Role:    Suzy Bouchard* Treyten Monestime J Alverda Nazzaro, MD - Primary  PHYSICIAN ASSISTANT: jones, caron  cnm   ASSISTANTS: none   ANESTHESIA:   epidural  EBL:  Total I/O In: -  Out: 600 [Urine:600] EBL 800  IOF 1400 BLOOD ADMINISTERED:none  DRAINS: Urinary Catheter (Foley)   LOCAL MEDICATIONS USED:  NONE  SPECIMEN:  Source of Specimen:  left ovary  DISPOSITION OF SPECIMEN:  PATHOLOGY  COUNTS:  YES  TOURNIQUET:  * No tourniquets in log *  DICTATION: .Other Dictation: Dictation Number verbal   PLAN OF CARE: Admit to inpatient   PATIENT DISPOSITION:  PACU - hemodynamically stable.   Delay start of Pharmacological VTE agent (>24hrs) due to surgical blood loss or risk of bleeding: not applicable

## 2015-02-05 NOTE — Progress Notes (Signed)
Patient ID: [name], female   DOB: [birth year], 34 y.o.   MRN: [id number]  Pt had run of late decels and then corrected and then the FHR dropped to 60 x 4 mins with Pitocin off. Cx 5/100/vtx-1/-2 with molding present. Dr Elliot DallySchernerhorn notified and lTCS called at [phone]. Pt and husband aware and agree with plan of care. OR and anesthesia called.

## 2015-02-05 NOTE — Transfer of Care (Signed)
Immediate Anesthesia Transfer of Care Note  Patient: Sabrina CrankerSara N Shanahan  Procedure(s) Performed: Procedure(s): CESAREAN SECTION (N/A)  Patient Location: [facility]  Anesthesia Type:Regional  Level of Consciousness: awake, alert  and oriented  Airway & Oxygen Therapy: Patient Spontanous Breathing  Post-op Assessment: Report given to RN and Post -op Vital signs reviewed and stable  Post vital signs: Reviewed and stable  Last Vitals:  Filed Vitals:   02/05/15 0536  BP: 113/56  Pulse: 63  Temp:   Resp:    Temp 98.2 RR: 22 Complications: No apparent anesthesia complications

## 2015-02-05 NOTE — Progress Notes (Signed)
Sabrina Freeman is a 34 y.o. G2P1000 at 8340w6d by LMP admitted for induction of labor due to Hypertension.  Subjective: Resting  Objective: BP 79/43 mmHg  Pulse 70  Temp(Src) 98.5 F (36.9 C) (Oral)  Resp 20  Ht 5\' 8"  (1.727 m)  Wt 107.049 kg (236 lb)  BMI 35.89 kg/m2  SpO2 99%  LMP 04/30/2014  Breastfeeding? Unknown I/O last 3 completed shifts: In: 300 [I.V.:300] Out: -  Total I/O In: 750 [I.V.:750] Out: -   FHT:  FHR: 140, +accels, 3 variable decels that at first looked like lates but, are U'V and W's  bpm, variability: moderate,  accelerations:  Present,  decelerations:  Present variables UC:   regular, every 2-3 minutes, >250 MVU's SVE:   Dilation: 4 Effacement (%): 80 Station: -1, -2 Exam by:: c.Nat Lowenthal cnm  Labs: Lab Results  Component Value Date   WBC 8.0 02/03/2015   HGB 12.7 02/03/2015   HCT 36.5 02/03/2015   MCV 91.6 02/03/2015   PLT 235 02/03/2015    Assessment / Plan: Induction of labor due to gestational hypertension,  progressing well on pitocin  Labor: Progressing normally Preeclampsia:  none Fetal Wellbeing:  Category II Pain Control:  Epidural I/D:  n/a Anticipated MOD:  NSVD  Sharee PimpleJONES, Sabrina Freeman, Sabrina Freeman

## 2015-02-05 NOTE — Progress Notes (Signed)
Pt now has not progress past 5 cm for the past 6 hrs . Active phase arrest.  Explained to the pt about the role and need for c/s . Risks discussed with the pt and husband. All questions answered .  Pt declines on Q pump .

## 2015-02-05 NOTE — Progress Notes (Signed)
Sabrina Freeman is a 34 y.o. G2P1000 at 8397w6d by LMP admitted for induction of labor due to Hypertension.  Subjective: Sleeping soundly  Objective: BP 113/60 mmHg  Pulse 67  Temp(Src) 98.5 F (36.9 C) (Oral)  Resp 20  Ht 5\' 8"  (1.727 m)  Wt 107.049 kg (236 lb)  BMI 35.89 kg/m2  SpO2 97%  LMP 04/30/2014  Breastfeeding? Unknown I/O last 3 completed shifts: In: 300 [I.V.:300] Out: -  Total I/O In: 2083.3 [I.V.:1983.3; IV Piggyback:100] Out: -   FHT:  FHR: called to review strip: late decels that recovered by the time I arrived. No further decels. Mod variability.   bpm, variability: moderate,  accelerations:  Present,  decelerations:  Present mod UC:   regular, every  minutes SVE:   Dilation: 4.5 Effacement (%): 80 Station: -1, -2 Exam by:: T.Roberts RN  Labs: Lab Results  Component Value Date   WBC 8.0 02/03/2015   HGB 12.7 02/03/2015   HCT 36.5 02/03/2015   MCV 91.6 02/03/2015   PLT 235 02/03/2015    Assessment / Plan: Induction of labor due to gestational hypertension,  progressing well on pitocin  Labor: Progressing normally Preeclampsia:  none Fetal Wellbeing:  Category II Pain Control:  Epidural I/D:  n/a Anticipated MOD: Will anticipate SVD but, if not progressing, Jamesetta GeraldsLTCS  Teddy Pena W 02/05/2015, 5:15 AM

## 2015-02-06 LAB — CBC
HCT: 33 % — ABNORMAL LOW (ref 35.0–47.0)
HEMOGLOBIN: 11.1 g/dL — AB (ref 12.0–16.0)
MCH: 31.4 pg (ref 26.0–34.0)
MCHC: 33.5 g/dL (ref 32.0–36.0)
MCV: 93.9 fL (ref 80.0–100.0)
Platelets: 162 10*3/uL (ref 150–440)
RBC: 3.52 MIL/uL — ABNORMAL LOW (ref 3.80–5.20)
RDW: 13.7 % (ref 11.5–14.5)
WBC: 12.6 10*3/uL — ABNORMAL HIGH (ref 3.6–11.0)

## 2015-02-06 LAB — SURGICAL PATHOLOGY

## 2015-02-06 NOTE — Anesthesia Postprocedure Evaluation (Signed)
  Anesthesia Post-op Note  Patient: Sabrina CrankerSara N Nevares    Procedure(s) Performed: Procedure(s): CESAREAN SECTION (N/A)  Anesthesia type: epidural  Patient location: PACU  Post pain: Pain level controlled  Post assessment: Post-op Vital signs reviewed, Patient's Cardiovascular Status Stable, Respiratory Function Stable, Patent Airway and No signs of Nausea or vomiting  Post vital signs: Reviewed and stable  Last Vitals:  Filed Vitals:   02/06/15 0518  BP: 115/69  Pulse: 82  Temp: 36.6 C  Resp: 16    Level of consciousness: awake, alert  and patient cooperative  Complications: No apparent anesthesia complications

## 2015-02-06 NOTE — Progress Notes (Signed)
Subjective: Postpartum Day 1: Cesarean Delivery Patient reports no problems voiding.  Tolerating reg food   Objective: Vital signs in last 24 hours: Temp:  [97.9 F (36.6 C)-99.4 F (37.4 C)] 98.2 F (36.8 C) (06/29 0800) Pulse Rate:  [81-112] 81 (06/29 0800) Resp:  [16-19] 18 (06/29 0800) BP: (98-130)/(61-77) 110/67 mmHg (06/29 0800) SpO2:  [97 %-98 %] 97 % (06/29 0800)  Physical Exam:  General: alert and cooperative Lochia: appropriate Uterine Fundus: firm Incision: covered DVT Evaluation: No evidence of DVT seen on physical exam.   Recent Labs  02/03/15 2103 02/06/15 0615  HGB 12.7 11.1*  HCT 36.5 33.0*    Assessment/Plan: Status post Cesarean section. Doing well postoperatively.  Continue current care. D/c iv Yovan Leeman 02/06/2015, 11:05 AM

## 2015-02-06 NOTE — Anesthesia Post-op Follow-up Note (Signed)
  Anesthesia Pain Follow-up Note  Patient: Sabrina Freeman  Day #: 1  Date of Follow-up: 02/06/2015 Time: 7:29 AM  Last Vitals:  Filed Vitals:   02/06/15 0518  BP: 115/69  Pulse: 82  Temp: 36.6 C  Resp: 16    Level of Consciousness: alert  Pain: none   Side Effects:None  Catheter Site Exam:clean, dry, no drainage  Plan: D/C from anesthesia care  Chong SicilianLopez,  Allahna Husband

## 2015-02-06 NOTE — Anesthesia Preprocedure Evaluation (Signed)
Anesthesia Evaluation  Patient identified by MRN, date of birth, ID band Patient awake    Reviewed: Allergy & Precautions, H&P , NPO status , Patient's Chart, lab work & pertinent test results, reviewed documented beta blocker date and time   Airway Mallampati: III  TM Distance: >3 FB Neck ROM: full    Dental no notable dental hx. (+) Teeth Intact   Pulmonary neg pulmonary ROS,  breath sounds clear to auscultation  Pulmonary exam normal       Cardiovascular Exercise Tolerance: Good negative cardio ROS Normal cardiovascular exam+ Valvular Problems/Murmurs MVP Rhythm:regular Rate:Normal     Neuro/Psych negative neurological ROS  negative psych ROS   GI/Hepatic negative GI ROS, Neg liver ROS,   Endo/Other  negative endocrine ROS  Renal/GU negative Renal ROS  negative genitourinary   Musculoskeletal   Abdominal   Peds  Hematology negative hematology ROS (+)   Anesthesia Other Findings   Reproductive/Obstetrics (+) Pregnancy                             Anesthesia Physical Anesthesia Plan  ASA: II  Anesthesia Plan: Regional and Epidural   Post-op Pain Management:    Induction:   Airway Management Planned:   Additional Equipment:   Intra-op Plan:   Post-operative Plan:   Informed Consent: I have reviewed the patients History and Physical, chart, labs and discussed the procedure including the risks, benefits and alternatives for the proposed anesthesia with the patient or authorized representative who has indicated his/her understanding and acceptance.     Plan Discussed with: CRNA  Anesthesia Plan Comments:         Anesthesia Quick Evaluation

## 2015-02-06 NOTE — OR Nursing (Signed)
A specimen (being the Left Ovary) was previously sent and thus ordered on another charting experience.  This note is to confirm that the specimen was sent and processed by pathology.

## 2015-02-07 ENCOUNTER — Encounter: Payer: Self-pay | Admitting: Obstetrics and Gynecology

## 2015-02-07 NOTE — Discharge Instructions (Signed)
Cesarean Delivery, Care After Refer to this sheet in the next few weeks. These instructions provide you with information on caring for yourself after your procedure. Your health care provider may also give you specific instructions. Your treatment has been planned according to current medical practices, but problems sometimes occur. Call your health care provider if you have any problems or questions after you go home. HOME CARE INSTRUCTIONS  Only take over-the-counter or prescription medications as directed by your health care provider.  Do not drink alcohol, especially if you are breastfeeding or taking medication to relieve pain.  Do not chew or smoke tobacco.  Continue to use good perineal care. Good perineal care includes:  Wiping your perineum from front to back.  Keeping your perineum clean.  Check your surgical cut (incision) daily for increased redness, drainage, swelling, or separation of skin.  Clean your incision gently with soap and water every day, and then pat it dry. If your health care provider says it is okay, leave the incision uncovered. Use a bandage (dressing) if the incision is draining fluid or appears irritated. If the adhesive strips across the incision do not fall off within 7 days, carefully peel them off.  Hug a pillow when coughing or sneezing until your incision is healed. This helps to relieve pain.  Do not use tampons, douches, or take tub baths for the next 6 weeks.   Wear a well-fitting bra that provides breast support.  Limit wearing support panties or control-top hose.  Drink enough fluids to keep your urine clear or pale yellow.  Eat high-fiber foods such as whole grain cereals and breads, brown rice, beans, and fresh fruits and vegetables every day. These foods may help prevent or relieve constipation.  Resume activities such as climbing stairs, driving, lifting, exercising, or traveling as directed by your health care provider.  No sexual  intercourse for the next 6 weeks.   Try to have someone help you with your household activities and your newborn for at least a few days after you leave the hospital.  Rest as much as possible. Try to rest or take a nap when your newborn is sleeping.  Increase your activities gradually.  Keep all of your scheduled postpartum appointments. It is very important to keep your scheduled follow-up appointments. At these appointments, your health care provider will be checking to make sure that you are healing physically and emotionally. SEEK MEDICAL CARE IF:   You are passing large clots from your vagina. Save any clots to show your health care provider.  You have a foul smelling discharge from your vagina.  You have trouble urinating.  You are urinating frequently.  You have pain when you urinate.  You have a change in your bowel movements.  You have increasing redness, pain, or swelling near your incision.  You have pus draining from your incision.  Your incision is separating.  You have painful, hard, or reddened breasts.  You have a severe headache.  You have blurred vision or see spots.  You feel sad or depressed.  You have thoughts of hurting yourself or your newborn.  You have questions about your care, the care of your newborn, or medications.  You are dizzy or light-headed.  You have a rash.  You have pain, redness, or swelling at the site of the removed intravenous access (IV) tube.  You have nausea or vomiting.  You stopped breastfeeding and have not had a menstrual period within 12 weeks of stopping.  You are not breastfeeding and have not had a menstrual period within 12 weeks of delivery.  You have a fever. SEEK IMMEDIATE MEDICAL CARE IF:  You have persistent pain.  You have chest pain.  You have shortness of breath.  You faint.  You have leg pain.  You have stomach pain.  Your vaginal bleeding saturates 2 or more sanitary pads in 1  hour. MAKE SURE YOU:   Understand these instructions.  Will watch your condition.  Will get help right away if you are not doing well or get worse.

## 2015-02-07 NOTE — Anesthesia Postprocedure Evaluation (Signed)
  Anesthesia Post-op Note  Patient: Sabrina CrankerSara N Freeman  Procedure(s) Performed: Epidural  Anesthesia type: Epidural Patient location: 338  Post pain: Pain level controlled  Post assessment: Post-op Vital signs reviewed, Patient's Cardiovascular Status Stable, Respiratory Function Stable, Patent Airway and No signs of Nausea or vomiting  Post vital signs: Reviewed and stable  Last Vitals:  Filed Vitals:   02/07/15 0500  BP:   Pulse:   Temp: 36.7 C  Resp:     Level of consciousness: awake, alert  and patient cooperative  Complications: No apparent anesthesia complications

## 2015-02-07 NOTE — Progress Notes (Signed)
Post Partum Day 2/POD#2 Subjective: + gas pains under shoulders and cannot pass, prune juice and will try coffee  Objective: Blood pressure 124/75, pulse 98, temperature 98.7 F (37.1 C), temperature source Oral, resp. rate 18, height 5\' 8"  (1.727 m), weight 107.049 kg (236 lb), last menstrual period 04/30/2014, SpO2 99 %, unknown if currently breastfeeding.  Physical Exam:  General: alert Lochia: appropriate Uterine Fundus: firm Incision: healing well DVT Evaluation: No evidence of DVT seen on physical exam. Heart: S1S2, RRR, no M/R/G. Lungs: CTA bilat, No W/R/R. Abd: soft/no distenstion/+audible BS.  Voiding well.   Recent Labs  02/06/15 0615  HGB 11.1*  HCT 33.0*    Assessment/Plan: Plan for discharge tomorrow  Anticipate flatus or BM soon Ambulate as much as possible   LOS: 4 days   Sharee PimpleJONES, CARON W 02/07/2015, 8:54 AM

## 2015-02-07 NOTE — Lactation Note (Signed)
This note was copied from the chart of Sabrina Rogelia RohrerSara Swartzentruber. Lactation Consultation Note  Patient Name: Sabrina Freeman ZOXWR'UToday's Date: 02/07/2015     Maternal Data  Education about engorgement given.  Feeding Feeding Type: Bottle Fed - Formula Nipple Type: Slow - flow  LATCH Score/Interventions                      Lactation Tools Discussed/Used     Consult Status      Gilman SchmidtCarolyn P Vieva Brummitt 02/07/2015, 11:30 AM

## 2015-02-07 NOTE — Anesthesia Post-op Follow-up Note (Cosign Needed)
  Anesthesia Pain Follow-up Note  Patient: Sabrina Freeman  Day #: 3  Date of Follow-up: 02/07/2015 Time: 7:24 AM  Last Vitals:  Filed Vitals:   02/07/15 0500  BP:   Pulse:   Temp: 36.7 C  Resp:     Level of Consciousness: alert  Pain: none   Side Effects:None  Catheter Site Exam:clean, dry, no drainage  Plan: D/C from anesthesia care  Maryella ShiversSimmons,  Chevelle Durr A

## 2015-02-08 MED ORDER — IBUPROFEN 600 MG PO TABS: 600.0000 mg | ORAL_TABLET | Freq: Four times a day (QID) | ORAL | Status: DC | PRN

## 2015-02-08 MED ORDER — OXYCODONE-ACETAMINOPHEN 5-325 MG PO TABS: 1.0000 | ORAL_TABLET | ORAL | Status: DC | PRN

## 2015-02-08 NOTE — Discharge Summary (Addendum)
Obstetric Discharge Summary Reason for Admission: induction of labor Prenatal Procedures: none Intrapartum Procedures: cesarean: low cervical, transverse, left oophorectomy for mature cystic teratoma.  Postpartum Procedures: none Complications-Operative and Postpartum: none HEMOGLOBIN  Date Value Ref Range Status  02/06/2015 11.1* 12.0 - 16.0 g/dL Final   HCT  Date Value Ref Range Status  02/06/2015 33.0* 35.0 - 47.0 % Final    Physical Exam:  General: alert, cooperative and no distress Lochia: appropriate Uterine Fundus: firm Incision: healing well, no significant drainage, no dehiscence DVT Evaluation: No evidence of DVT seen on physical exam.  Discharge Diagnoses: Term Pregnancy-delivered  Discharge Information: Date: 02/08/2015 Activity: pelvic rest and no lifting >15 lbs.  Diet: routine Medications: PNV, Ibuprofen, Colace and Percocet Condition: stable Instructions: refer to practice specific booklet Discharge to: home   Newborn Data: Live born female  Birth Weight: 9 lb 3.8 oz (4190 g) APGAR: 2, 9  Home with mother.  GOODMAN, DAVID MICHAEL 02/08/2015, 9:30 AM

## 2016-02-17 ENCOUNTER — Other Ambulatory Visit: Payer: Self-pay | Admitting: Obstetrics and Gynecology

## 2016-02-17 DIAGNOSIS — N644 Mastodynia: Secondary | ICD-10-CM

## 2016-10-01 ENCOUNTER — Inpatient Hospital Stay
Admission: EM | Admit: 2016-10-01 | Discharge: 2016-10-01 | Disposition: A | Discharge status: Home / Self Care | Payer: Managed Care, Other (non HMO) | Attending: Obstetrics & Gynecology | Admitting: Obstetrics & Gynecology

## 2016-10-01 DIAGNOSIS — Z809 Family history of malignant neoplasm, unspecified: Secondary | ICD-10-CM | POA: Diagnosis not present

## 2016-10-01 DIAGNOSIS — Z3A35 35 weeks gestation of pregnancy: Secondary | ICD-10-CM | POA: Insufficient documentation

## 2016-10-01 DIAGNOSIS — Z9889 Other specified postprocedural states: Secondary | ICD-10-CM | POA: Insufficient documentation

## 2016-10-01 DIAGNOSIS — Z7982 Long term (current) use of aspirin: Secondary | ICD-10-CM | POA: Insufficient documentation

## 2016-10-01 DIAGNOSIS — O34219 Maternal care for unspecified type scar from previous cesarean delivery: Secondary | ICD-10-CM | POA: Insufficient documentation

## 2016-10-01 DIAGNOSIS — Z79899 Other long term (current) drug therapy: Secondary | ICD-10-CM | POA: Insufficient documentation

## 2016-10-01 DIAGNOSIS — O0993 Supervision of high risk pregnancy, unspecified, third trimester: Secondary | ICD-10-CM | POA: Diagnosis not present

## 2016-10-01 DIAGNOSIS — Z8249 Family history of ischemic heart disease and other diseases of the circulatory system: Secondary | ICD-10-CM | POA: Diagnosis not present

## 2016-10-01 NOTE — Discharge Instructions (Signed)
Call your provider for any other concerns. °

## 2016-10-01 NOTE — OB Triage Note (Signed)
Pt G2P1 34 weeks presents [facility] monitor fetal heart rate. BP: 150/83. Pain 0/10. Pt denies leaking of fluid/vaginal discharge. Pt states + FM. Denies headache, blurry vision, seeing spots, epigastric pain. Plus one reflexes, absent of clonus, non pitting edema in BLE. Monitoring FHR.

## 2016-10-01 NOTE — Discharge Summary (Signed)
Patient discharged home, discharge instructions given, patient states understanding. Patient left floor in stable condition, denies any other needs at this time. Patient to keep next scheduled OB appointment 

## 2016-10-08 ENCOUNTER — Inpatient Hospital Stay
Admission: RE | Admit: 2016-10-08 | Discharge: 2016-10-08 | Disposition: A | Discharge status: Home / Self Care | Payer: Managed Care, Other (non HMO) | Source: Ambulatory Visit | Attending: Obstetrics and Gynecology | Admitting: Obstetrics and Gynecology

## 2016-10-08 ENCOUNTER — Encounter: Payer: Self-pay | Admitting: *Deleted

## 2016-10-08 DIAGNOSIS — Z3A35 35 weeks gestation of pregnancy: Secondary | ICD-10-CM | POA: Insufficient documentation

## 2016-10-08 DIAGNOSIS — O26893 Other specified pregnancy related conditions, third trimester: Secondary | ICD-10-CM | POA: Insufficient documentation

## 2016-10-08 DIAGNOSIS — R030 Elevated blood-pressure reading, without diagnosis of hypertension: Secondary | ICD-10-CM | POA: Insufficient documentation

## 2016-10-08 NOTE — OB Triage Provider Note (Signed)
   Triage visit for NST   Sabrina Freeman is a 36 y.o. G2P1001. She is at 9219w0d gestation. She presents for a scheduled NST for elevated BP without dx of PreE  Indication: Non reassuring NST in office  S: Resting comfortably. no CTX, no VB. Active fetal movement. No HA, SOB, visual changes or RUQ pain.  Labs wnl last week O:  BP (!) 152/92   Pulse 99   Resp 18   LMP  (Approximate)  No results found for this or any previous visit (from the past 48 hour(s)).   Gen: NAD, AAOx3      Abd: FNTTP      Ext: Non-tender, Nonedmeatous    NST  Baseline: 125 Variability: moderate Accelerations present x >2 Decelerations absent Time 20mins  Interpretation: reactive NST, category 1 tracing  A/P:  36 y.o. G2P1001 9619w0d with elevated BP.    Will dip urine for protein, as BP <severe.   Reactive NST, with moderate variability and accelerations, no decels  Fetal Wellbeing: Reassuring  D/c home if protein stable, precautions reviewed, follow-up as scheduled.     ----- Sabrina DouglasBethany Fonda Rochon, MD MPH Attending Obstetrician and Gynecologist [facility], Department of OB/GYN [facility]

## 2016-10-08 NOTE — OB Triage Note (Signed)
Urine protein on dipstick negative.

## 2016-10-09 NOTE — Procedures (Signed)
NST  LMP: 02/06/16 Estimated Date of Delivery: 11/12/16  Baseline: 125 Variability: moderate Accelerations present x >2 Decelerations absent Time 20mins  Interpretation: reactive NST, category 1 tracing  ----- Ranae Plumberhelsea Quamesha Mullet, MD Attending Obstetrician and Gynecologist [facility], Department of OB/GYN [facility]

## 2016-10-09 NOTE — Discharge Summary (Signed)
Gildardo CrankerSara N Plaut is a 36 y.o. female. She is at 331w1d gestation. No LMP recorded (approximate). Patient is pregnant. Estimated Date of Delivery: 11/12/16  Chief Complaint: need for fetal monitoring  S: Resting comfortably. no CTX, no VB.no LOF,  Active fetal movement.   Patient was sent from office due to current high-risk pregnancy with GHTN, and NST with difficulty to maintain continuous tracing of baby.  Perform NST here. She has no complaints.    Maternal Medical History:   Past Medical History:  Diagnosis Date  . External hemorrhoids   . Heart murmur   . Indication for care in labor or delivery   . Leukopenia     Past Surgical History:  Procedure Laterality Date  . CESAREAN SECTION N/A 02/05/2015   Procedure: CESAREAN SECTION;  Surgeon: Suzy Bouchardhomas J Schermerhorn, MD;  Location: [facility] ORS;  Service: Obstetrics;  Laterality: N/A;  . OOPHORECTOMY Left 02/05/2015   Procedure: OOPHORECTOMY;  Surgeon: Suzy Bouchardhomas J Schermerhorn, MD;  Location: [facility] ORS;  Service: Obstetrics;  Laterality: Left;  . WISDOM TOOTH EXTRACTION      No Known Allergies  Prior to Admission medications   Medication Sig Start Date End Date Taking? Authorizing Provider  ferrous fumarate (HEMOCYTE - 106 MG FE) 325 (106 FE) MG TABS tablet Take 1 tablet by mouth.   Yes Historical Provider, MD  folic acid (FOLVITE) 400 MCG tablet Take 400 mcg by mouth daily.   Yes Historical Provider, MD  Prenatal Vit-Fe Fumarate-FA (PRENATAL MULTIVITAMIN) TABS tablet Take 1 tablet by mouth daily at 12 noon.   Yes Historical Provider, MD  Vitamin D, Ergocalciferol, (DRISDOL) 50000 UNITS CAPS capsule Take 50,000 Units by mouth every 7 (seven) days.   Yes Historical Provider, MD  aspirin 81 MG tablet Take 81 mg by mouth daily.    Historical Provider, MD  ibuprofen (ADVIL,MOTRIN) 600 MG tablet Take 1 tablet (600 mg total) by mouth every 6 (six) hours as needed for mild pain. Patient not taking: Reported on 10/01/2016 02/08/15   Wille Celesteavid Michael  Goodman, MD  oxyCODONE-acetaminophen (PERCOCET/ROXICET) 5-325 MG per tablet Take 1-2 tablets by mouth every 4 (four) hours as needed for moderate pain or severe pain (1: Moderate 2: Severe). Patient not taking: Reported on 10/01/2016 02/08/15   Wille Celesteavid Michael Goodman, MD     [facility]   Social History: She  reports that she has never smoked. She has never used smokeless tobacco. She reports that she does not drink alcohol or use drugs.  Family History: family history includes Cancer in her maternal grandfather and maternal grandmother; Heart failure in her paternal grandfather; Hernia in her paternal grandfather.  no history of gyn cancers  Review of Systems: A full review of systems was performed and negative except as noted in the HPI.     O:  BP 132/78   Pulse 96   Temp 97.7 F (36.5 C) (Oral)   Resp 18   Ht 5\' 8"  (1.727 m)   Wt 110.2 kg (243 lb)   LMP  (Approximate)   BMI 36.95 kg/m  No results found for this or any previous visit (from the past 48 hour(s)).   Constitutional: NAD, AAOx3  HE/ENT: extraocular movements grossly intact, moist mucous membranes CV: RRR PULM: nl respiratory effort, CTABL     Abd: gravid, non-tender, non-distended, soft      Ext: Non-tender, Nonedmeatous   Psych: mood appropriate, speech normal Pelvic:  FHT: 125 mod + accels no decels, see NST  TOCO: quiet  No results found.  A/P:  35yo G2P1001 with GHTN and reactive NST.    Labor: not present.   Fetal Wellbeing: Reassuring Cat 1 tracing.  D/c home stable, precautions reviewed, follow-up as scheduled.   ----- Ranae Plumber, MD Attending Obstetrician and Gynecologist [facility], Department of OB/GYN [facility]

## 2016-10-15 ENCOUNTER — Observation Stay
Admission: AD | Admit: 2016-10-15 | Discharge: 2016-10-15 | DRG: 782 | Disposition: A | Discharge status: Home / Self Care | Payer: Managed Care, Other (non HMO) | Source: Ambulatory Visit | Attending: Obstetrics & Gynecology | Admitting: Obstetrics & Gynecology

## 2016-10-15 DIAGNOSIS — O133 Gestational [pregnancy-induced] hypertension without significant proteinuria, third trimester: Principal | ICD-10-CM | POA: Diagnosis present

## 2016-10-15 DIAGNOSIS — Z7982 Long term (current) use of aspirin: Secondary | ICD-10-CM | POA: Diagnosis not present

## 2016-10-15 DIAGNOSIS — Z79899 Other long term (current) drug therapy: Secondary | ICD-10-CM | POA: Diagnosis not present

## 2016-10-15 DIAGNOSIS — Z3A36 36 weeks gestation of pregnancy: Secondary | ICD-10-CM

## 2016-10-15 DIAGNOSIS — O0993 Supervision of high risk pregnancy, unspecified, third trimester: Secondary | ICD-10-CM

## 2016-10-15 NOTE — Discharge Summary (Signed)
Sabrina Freeman is a 36 y.o. female. She is at 6963w0d gestation. LMP 02/06/2016 Estimated Date of Delivery: 11/12/16  Chief Complaint: need for fetal monitoring  S: Resting comfortably. no CTX, no VB.no LOF,  Active fetal movement.   Patient has current high-risk pregnancy with GHTN   She has no complaints.    Maternal Medical History:   Past Medical History:  Diagnosis Date  . External hemorrhoids   . Heart murmur   . Indication for care in labor or delivery   . Leukopenia     Past Surgical History:  Procedure Laterality Date  . CESAREAN SECTION N/A 02/05/2015   Procedure: CESAREAN SECTION;  Surgeon: Sabrina Bouchardhomas J Schermerhorn, MD;  Location: [facility] ORS;  Service: Obstetrics;  Laterality: N/A;  . OOPHORECTOMY Left 02/05/2015   Procedure: OOPHORECTOMY;  Surgeon: Sabrina Bouchardhomas J Schermerhorn, MD;  Location: [facility] ORS;  Service: Obstetrics;  Laterality: Left;  . WISDOM TOOTH EXTRACTION      No Known Allergies  Prior to Admission medications   Medication Sig Start Date End Date Taking? Authorizing Provider  ferrous fumarate (HEMOCYTE - 106 MG FE) 325 (106 FE) MG TABS tablet Take 1 tablet by mouth.   Yes Historical Provider, MD  folic acid (FOLVITE) 400 MCG tablet Take 400 mcg by mouth daily.   Yes Historical Provider, MD  Prenatal Vit-Fe Fumarate-FA (PRENATAL MULTIVITAMIN) TABS tablet Take 1 tablet by mouth daily at 12 noon.   Yes Historical Provider, MD  Vitamin D, Ergocalciferol, (DRISDOL) 50000 UNITS CAPS capsule Take 50,000 Units by mouth every 7 (seven) days.   Yes Historical Provider, MD  aspirin 81 MG tablet Take 81 mg by mouth daily.    Historical Provider, MD  ibuprofen (ADVIL,MOTRIN) 600 MG tablet Take 1 tablet (600 mg total) by mouth every 6 (six) hours as needed for mild pain. Patient not taking: Reported on 10/01/2016 02/08/15   Sabrina Celesteavid Michael Goodman, MD  oxyCODONE-acetaminophen (PERCOCET/ROXICET) 5-325 MG per tablet Take 1-2 tablets by mouth every 4 (four) hours as needed for moderate  pain or severe pain (1: Moderate 2: Severe). Patient not taking: Reported on 10/01/2016 02/08/15   Sabrina Celesteavid Michael Goodman, MD     [facility]   Social History: She  reports that she has never smoked. She has never used smokeless tobacco. She reports that she does not drink alcohol or use drugs.  Family History: family history includes Cancer in her maternal grandfather and maternal grandmother; Heart failure in her paternal grandfather; Hernia in her paternal grandfather.  no history of gyn cancers  Review of Systems: A full review of systems was performed and negative except as noted in the HPI.     O:  BP 138/87   Pulse (!) 108   LMP  (Approximate)    Constitutional: NAD, AAOx3  HE/ENT: extraocular movements grossly intact, moist mucous membranes PULM: nl respiratory effort     Abd: gravid, non-tender, non-distended, soft      Ext: Non-tender, Nonedmeatous   Psych: mood appropriate, speech normal Pelvic: deferred  FHT: 140 mod + accels no decels, see NST TOCO: infrequent    A/P:  35yo G2P1001 at 36.0 with GHTN and reactive NST.    Labor: not present.   Fetal Wellbeing: Reassuring Cat 1 tracing.  D/c home stable, precautions reviewed, follow-up as scheduled.   ----- Sabrina Plumberhelsea Armon Orvis, MD Attending Obstetrician and Gynecologist [facility], Department of OB/GYN [facility]

## 2016-10-15 NOTE — OB Triage Note (Signed)
Scheduled NST 

## 2016-10-15 NOTE — Discharge Instructions (Signed)
Drink plenty of fluid and get plenty of rest. Call your provider for any other concerns °

## 2016-10-15 NOTE — Procedures (Signed)
NST  LMP: 02/06/2016 Estimated Date of Delivery: 11/12/16  Baseline: 140 Variability: moderate Accelerations present x >2 Decelerations absent Time 20mins  Interpretation: reactive NST, category 1 tracing  ----- Ranae Plumberhelsea Casimir Barcellos, MD Attending Obstetrician and Gynecologist [facility], Department of OB/GYN [facility]

## 2016-10-21 ENCOUNTER — Inpatient Hospital Stay
Admission: RE | Admit: 2016-10-21 | Discharge: 2016-10-21 | Disposition: A | Discharge status: Home / Self Care | Payer: Managed Care, Other (non HMO) | Attending: Obstetrics and Gynecology | Admitting: Obstetrics and Gynecology

## 2016-10-21 DIAGNOSIS — Z3A36 36 weeks gestation of pregnancy: Secondary | ICD-10-CM | POA: Diagnosis not present

## 2016-10-21 DIAGNOSIS — O133 Gestational [pregnancy-induced] hypertension without significant proteinuria, third trimester: Secondary | ICD-10-CM | POA: Diagnosis not present

## 2016-10-21 NOTE — Progress Notes (Signed)
   Triage visit for NST   Sabrina CrankerSara N Okeefe is a 36 y.o. G2P1001. She is at 950w6d gestation. She presents for a scheduled NST.  Indication: Gestational Hypertension   O:  BP 132/84   Pulse (!) 114   Temp 97.9 F (36.6 C) (Oral)   Resp 16   LMP  (Approximate)  No results found for this or any previous visit (from the past 48 hour(s)).   Gen: NAD, AAOx3      Abd: FNTTP      Ext: Non-tender, Nonedmeatous    NST  Baseline: 140 bpm  Variability: moderate Accelerations present x >2 Decelerations absent Time 20mins  Interpretation: reactive NST, category 1 tracing  A/P:  36 y.o. G2P1001 5050w6d with gestational hypertension.     Reactive NST, with moderate variability and accelerations, no decels  Fetal Wellbeing: Reassuring  Fetal kick counts daily  Preterm labor precautions and warning s/s reviewed  D/c home stable, precautions reviewed, follow-up as scheduled.  Has an appointment today with TJS.   Carlean JewsMeredith Chantil Bari, CNM

## 2016-10-21 NOTE — H&P (Signed)
Sabrina CrankerSara N Freeman is a 36 y.o. female presenting for elective repeat LTCS on 11/09/16.  no meds for HTN  OB History    Gravida Para Term Preterm AB Living   2 1 1  0 0 1   SAB TAB Ectopic Multiple Live Births   0 0 0 0 1      Obstetric Comments   Gestational HTN without proteinuria and tx without meds     Past Medical History:  Diagnosis Date  . External hemorrhoids   . Heart murmur   . Indication for care in labor or delivery   . Leukopenia    Past Surgical History:  Procedure Laterality Date  . CESAREAN SECTION N/A 02/05/2015   Procedure: CESAREAN SECTION;  Surgeon: Suzy Bouchardhomas J Cletis Clack, MD;  Location: [facility] ORS;  Service: Obstetrics;  Laterality: N/A;  . OOPHORECTOMY Left 02/05/2015   Procedure: OOPHORECTOMY;  Surgeon: Suzy Bouchardhomas J Almendra Loria, MD;  Location: [facility] ORS;  Service: Obstetrics;  Laterality: Left;  . WISDOM TOOTH EXTRACTION     Family History: family history includes Cancer in her maternal grandfather and maternal grandmother; Heart failure in her paternal grandfather; Hernia in her paternal grandfather. Social History:  reports that she has never smoked. She has never used smokeless tobacco. She reports that she does not drink alcohol or use drugs.     Maternal Diabetes: No Genetic Screening: Normal Maternal Ultrasounds/Referrals: Abnormal:  Findings:   Other:LGA Fetal Ultrasounds or other Referrals:  None Maternal Substance Abuse:  No Significant Maternal Medications:  ASA 81 mg , iron , Significant Maternal Lab Results:  None Other Comments:  None  ROS History   unknown if currently breastfeeding. Exam Physical Exam  Prenatal labs: ABO, Rh:  O+ Antibody:  neg  Rubella:  Imm RPR:   neg  HBsAg:   neg  HIV:   neg  GBS:   +GBS   Assessment/Plan: Elective repeat LTCS  Declines BTL   Sabrina Freeman 10/21/2016, 4:00 PM

## 2016-10-21 NOTE — OB Triage Note (Signed)
Pt G2P1 presents to L/D for scheduled NST. VSS. Monitors applied and assessing.

## 2016-10-28 ENCOUNTER — Observation Stay
Admission: RE | Admit: 2016-10-28 | Discharge: 2016-10-28 | Disposition: A | Discharge status: Home / Self Care | Payer: Managed Care, Other (non HMO) | Source: Ambulatory Visit | Attending: Obstetrics & Gynecology | Admitting: Obstetrics & Gynecology

## 2016-10-28 ENCOUNTER — Encounter: Payer: Self-pay | Admitting: *Deleted

## 2016-10-28 DIAGNOSIS — Z3480 Encounter for supervision of other normal pregnancy, unspecified trimester: Secondary | ICD-10-CM | POA: Diagnosis not present

## 2016-10-28 DIAGNOSIS — R011 Cardiac murmur, unspecified: Secondary | ICD-10-CM | POA: Diagnosis not present

## 2016-10-28 DIAGNOSIS — O139 Gestational [pregnancy-induced] hypertension without significant proteinuria, unspecified trimester: Secondary | ICD-10-CM | POA: Insufficient documentation

## 2016-10-28 DIAGNOSIS — Z3A00 Weeks of gestation of pregnancy not specified: Secondary | ICD-10-CM | POA: Diagnosis not present

## 2016-10-28 DIAGNOSIS — O99419 Diseases of the circulatory system complicating pregnancy, unspecified trimester: Secondary | ICD-10-CM | POA: Diagnosis not present

## 2016-10-28 DIAGNOSIS — Z809 Family history of malignant neoplasm, unspecified: Secondary | ICD-10-CM | POA: Diagnosis not present

## 2016-10-28 DIAGNOSIS — Z8249 Family history of ischemic heart disease and other diseases of the circulatory system: Secondary | ICD-10-CM | POA: Insufficient documentation

## 2016-10-28 DIAGNOSIS — Z3493 Encounter for supervision of normal pregnancy, unspecified, third trimester: Secondary | ICD-10-CM | POA: Diagnosis present

## 2016-10-28 DIAGNOSIS — Z79899 Other long term (current) drug therapy: Secondary | ICD-10-CM | POA: Insufficient documentation

## 2016-10-28 DIAGNOSIS — Z90721 Acquired absence of ovaries, unilateral: Secondary | ICD-10-CM | POA: Diagnosis not present

## 2016-10-28 DIAGNOSIS — D72819 Decreased white blood cell count, unspecified: Secondary | ICD-10-CM | POA: Insufficient documentation

## 2016-11-02 ENCOUNTER — Encounter
Admission: EM | Disposition: A | Discharge status: Home / Self Care | Payer: Self-pay | Source: Home / Self Care | Attending: Obstetrics and Gynecology

## 2016-11-02 ENCOUNTER — Inpatient Hospital Stay
Admission: EM | Admit: 2016-11-02 | Discharge: 2016-11-06 | DRG: 766 | Disposition: A | Discharge status: Home / Self Care | Payer: Managed Care, Other (non HMO) | Attending: Obstetrics and Gynecology | Admitting: Obstetrics and Gynecology

## 2016-11-02 ENCOUNTER — Inpatient Hospital Stay: Payer: Managed Care, Other (non HMO) | Admitting: Certified Registered Nurse Anesthetist

## 2016-11-02 DIAGNOSIS — O34211 Maternal care for low transverse scar from previous cesarean delivery: Secondary | ICD-10-CM | POA: Diagnosis present

## 2016-11-02 DIAGNOSIS — Z8249 Family history of ischemic heart disease and other diseases of the circulatory system: Secondary | ICD-10-CM

## 2016-11-02 DIAGNOSIS — O99824 Streptococcus B carrier state complicating childbirth: Secondary | ICD-10-CM | POA: Diagnosis present

## 2016-11-02 DIAGNOSIS — K219 Gastro-esophageal reflux disease without esophagitis: Secondary | ICD-10-CM | POA: Diagnosis present

## 2016-11-02 DIAGNOSIS — Z9889 Other specified postprocedural states: Secondary | ICD-10-CM

## 2016-11-02 DIAGNOSIS — O134 Gestational [pregnancy-induced] hypertension without significant proteinuria, complicating childbirth: Secondary | ICD-10-CM | POA: Diagnosis present

## 2016-11-02 DIAGNOSIS — Z3A38 38 weeks gestation of pregnancy: Secondary | ICD-10-CM | POA: Diagnosis not present

## 2016-11-02 DIAGNOSIS — O9962 Diseases of the digestive system complicating childbirth: Secondary | ICD-10-CM | POA: Diagnosis present

## 2016-11-02 DIAGNOSIS — O479 False labor, unspecified: Secondary | ICD-10-CM | POA: Diagnosis present

## 2016-11-02 LAB — CBC
HEMATOCRIT: 37.9 % (ref 35.0–47.0)
HEMOGLOBIN: 13 g/dL (ref 12.0–16.0)
MCH: 31.4 pg (ref 26.0–34.0)
MCHC: 34.3 g/dL (ref 32.0–36.0)
MCV: 91.6 fL (ref 80.0–100.0)
PLATELETS: 197 10*3/uL (ref 150–440)
RBC: 4.13 MIL/uL (ref 3.80–5.20)
RDW: 13.5 % (ref 11.5–14.5)
WBC: 13.3 10*3/uL — AB (ref 3.6–11.0)

## 2016-11-02 LAB — TYPE AND SCREEN
ABO/RH(D): O POS
ANTIBODY SCREEN: NEGATIVE

## 2016-11-02 SURGERY — Surgical Case
Anesthesia: Spinal | Site: Abdomen | Wound class: Clean Contaminated

## 2016-11-02 MED ORDER — NALBUPHINE HCL 10 MG/ML IJ SOLN: 5.0000 mg | Freq: Once | INTRAMUSCULAR | Status: DC | PRN

## 2016-11-02 MED ORDER — KETOROLAC TROMETHAMINE 30 MG/ML IJ SOLN
30.0000 mg | Freq: Four times a day (QID) | INTRAMUSCULAR | Status: AC | PRN
  Administered 2016-11-02 – 2016-11-03 (×4): 30 mg via INTRAVENOUS
  Filled 2016-11-02 (×4): qty 1

## 2016-11-02 MED ORDER — MORPHINE SULFATE (PF) 0.5 MG/ML IJ SOLN
INTRAMUSCULAR | Status: AC
  Filled 2016-11-02: qty 10

## 2016-11-02 MED ORDER — CARBOPROST TROMETHAMINE 250 MCG/ML IM SOLN
INTRAMUSCULAR | Status: AC
Start: 2016-11-02 — End: 2016-11-02
  Filled 2016-11-02: qty 1

## 2016-11-02 MED ORDER — TETANUS-DIPHTH-ACELL PERTUSSIS 5-2.5-18.5 LF-MCG/0.5 IM SUSP: 0.5000 mL | Freq: Once | INTRAMUSCULAR | Status: DC

## 2016-11-02 MED ORDER — ONDANSETRON HCL 4 MG/2ML IJ SOLN: 4.0000 mg | Freq: Three times a day (TID) | INTRAMUSCULAR | Status: DC | PRN

## 2016-11-02 MED ORDER — ACETAMINOPHEN 325 MG PO TABS: 650.0000 mg | ORAL_TABLET | ORAL | Status: DC | PRN

## 2016-11-02 MED ORDER — PRENATAL MULTIVITAMIN CH
1.0000 | ORAL_TABLET | Freq: Every day | ORAL | Status: DC
  Administered 2016-11-03 – 2016-11-06 (×4): 1 via ORAL
  Filled 2016-11-02 (×4): qty 1

## 2016-11-02 MED ORDER — WITCH HAZEL-GLYCERIN EX PADS: 1.0000 "application " | MEDICATED_PAD | CUTANEOUS | Status: DC | PRN

## 2016-11-02 MED ORDER — TERBUTALINE SULFATE 1 MG/ML IJ SOLN
INTRAMUSCULAR | Status: AC
  Filled 2016-11-02: qty 1

## 2016-11-02 MED ORDER — MENTHOL 3 MG MT LOZG
1.0000 | LOZENGE | OROMUCOSAL | Status: DC | PRN
  Filled 2016-11-02: qty 9

## 2016-11-02 MED ORDER — OXYCODONE HCL 5 MG PO TABS
10.0000 mg | ORAL_TABLET | ORAL | Status: DC | PRN
  Administered 2016-11-03 – 2016-11-04 (×5): 10 mg via ORAL
  Administered 2016-11-05 (×4): 5 mg via ORAL
  Filled 2016-11-02: qty 1
  Filled 2016-11-02 (×2): qty 2

## 2016-11-02 MED ORDER — OXYTOCIN 40 UNITS IN LACTATED RINGERS INFUSION - SIMPLE MED
2.5000 [IU]/h | INTRAVENOUS | Status: DC
  Administered 2016-11-02: 100 mL via INTRAVENOUS
  Administered 2016-11-02: 550 mL via INTRAVENOUS
  Administered 2016-11-02: 50 mL via INTRAVENOUS

## 2016-11-02 MED ORDER — SIMETHICONE 80 MG PO CHEW
80.0000 mg | CHEWABLE_TABLET | Freq: Three times a day (TID) | ORAL | Status: DC
  Administered 2016-11-02 – 2016-11-06 (×11): 80 mg via ORAL
  Filled 2016-11-02 (×11): qty 1

## 2016-11-02 MED ORDER — MORPHINE SULFATE-NACL 0.5-0.9 MG/ML-% IV SOSY
PREFILLED_SYRINGE | INTRAVENOUS | Status: DC | PRN
  Administered 2016-11-02: .1 mg via INTRATHECAL

## 2016-11-02 MED ORDER — ONDANSETRON HCL 4 MG/2ML IJ SOLN: 4.0000 mg | Freq: Four times a day (QID) | INTRAMUSCULAR | Status: DC | PRN

## 2016-11-02 MED ORDER — ZOLPIDEM TARTRATE 5 MG PO TABS: 5.0000 mg | ORAL_TABLET | Freq: Every evening | ORAL | Status: DC | PRN

## 2016-11-02 MED ORDER — FENTANYL CITRATE (PF) 100 MCG/2ML IJ SOLN: 25.0000 ug | INTRAMUSCULAR | Status: DC | PRN

## 2016-11-02 MED ORDER — BUTORPHANOL TARTRATE 1 MG/ML IJ SOLN: 1.0000 mg | INTRAMUSCULAR | Status: DC | PRN

## 2016-11-02 MED ORDER — ONDANSETRON HCL 4 MG/2ML IJ SOLN
INTRAMUSCULAR | Status: DC | PRN
  Administered 2016-11-02: 4 mg via INTRAVENOUS

## 2016-11-02 MED ORDER — TERBUTALINE SULFATE 1 MG/ML IJ SOLN
0.2500 mg | Freq: Once | INTRAMUSCULAR | Status: AC
  Administered 2016-11-02: 0.25 mg via SUBCUTANEOUS

## 2016-11-02 MED ORDER — SIMETHICONE 80 MG PO CHEW
80.0000 mg | CHEWABLE_TABLET | ORAL | Status: DC
Start: 2016-11-03 — End: 2016-11-06
  Administered 2016-11-03 – 2016-11-05 (×4): 80 mg via ORAL
  Filled 2016-11-02 (×3): qty 1

## 2016-11-02 MED ORDER — SIMETHICONE 80 MG PO CHEW
80.0000 mg | CHEWABLE_TABLET | ORAL | Status: DC | PRN
  Administered 2016-11-03: 80 mg via ORAL
  Filled 2016-11-02 (×2): qty 1

## 2016-11-02 MED ORDER — DIBUCAINE 1 % RE OINT: 1.0000 "application " | TOPICAL_OINTMENT | RECTAL | Status: DC | PRN

## 2016-11-02 MED ORDER — OXYTOCIN BOLUS FROM INFUSION: 500.0000 mL | Freq: Once | INTRAVENOUS | Status: DC

## 2016-11-02 MED ORDER — DIPHENHYDRAMINE HCL 25 MG PO CAPS: 25.0000 mg | ORAL_CAPSULE | ORAL | Status: DC | PRN

## 2016-11-02 MED ORDER — BUPIVACAINE IN DEXTROSE 0.75-8.25 % IT SOLN
INTRATHECAL | Status: DC | PRN
  Administered 2016-11-02: 1.6 mL via INTRATHECAL

## 2016-11-02 MED ORDER — ONDANSETRON HCL 4 MG/2ML IJ SOLN: 4.0000 mg | Freq: Once | INTRAMUSCULAR | Status: DC | PRN

## 2016-11-02 MED ORDER — ALUM & MAG HYDROXIDE-SIMETH 200-200-20 MG/5ML PO SUSP: 15.0000 mL | ORAL | Status: DC | PRN

## 2016-11-02 MED ORDER — BUPIVACAINE LIPOSOME 1.3 % IJ SUSP
INTRAMUSCULAR | Status: DC | PRN
  Administered 2016-11-02: 50 mL

## 2016-11-02 MED ORDER — LACTATED RINGERS IV SOLN: INTRAVENOUS | Status: DC

## 2016-11-02 MED ORDER — DEXTROSE 5 % IV SOLN
3.0000 g | INTRAVENOUS | Status: AC
  Administered 2016-11-02: 3 g via INTRAVENOUS
  Filled 2016-11-02: qty 3000

## 2016-11-02 MED ORDER — DIPHENHYDRAMINE HCL 25 MG PO CAPS: 25.0000 mg | ORAL_CAPSULE | Freq: Four times a day (QID) | ORAL | Status: DC | PRN

## 2016-11-02 MED ORDER — BUPIVACAINE LIPOSOME 1.3 % IJ SUSP
INTRAMUSCULAR | Status: AC
  Filled 2016-11-02: qty 20

## 2016-11-02 MED ORDER — SOD CITRATE-CITRIC ACID 500-334 MG/5ML PO SOLN
30.0000 mL | ORAL | Status: AC
  Administered 2016-11-02: 30 mL via ORAL
  Filled 2016-11-02: qty 30
  Filled 2016-11-02: qty 15

## 2016-11-02 MED ORDER — NALOXONE HCL 2 MG/2ML IJ SOSY: 1.0000 ug/kg/h | PREFILLED_SYRINGE | INTRAMUSCULAR | Status: DC | PRN

## 2016-11-02 MED ORDER — SENNOSIDES-DOCUSATE SODIUM 8.6-50 MG PO TABS
2.0000 | ORAL_TABLET | ORAL | Status: DC
  Administered 2016-11-04 – 2016-11-05 (×2): 2 via ORAL
  Filled 2016-11-02 (×3): qty 2

## 2016-11-02 MED ORDER — LACTATED RINGERS IV SOLN
INTRAVENOUS | Status: DC
  Administered 2016-11-03: 06:00:00 via INTRAVENOUS

## 2016-11-02 MED ORDER — SOD CITRATE-CITRIC ACID 500-334 MG/5ML PO SOLN: 30.0000 mL | ORAL | Status: DC | PRN

## 2016-11-02 MED ORDER — COCONUT OIL OIL: 1.0000 "application " | TOPICAL_OIL | Status: DC | PRN

## 2016-11-02 MED ORDER — NALBUPHINE HCL 10 MG/ML IJ SOLN: 5.0000 mg | INTRAMUSCULAR | Status: DC | PRN

## 2016-11-02 MED ORDER — SODIUM CHLORIDE FLUSH 0.9 % IV SOLN
INTRAVENOUS | Status: AC
Start: 2016-11-02 — End: 2016-11-02
  Filled 2016-11-02: qty 10

## 2016-11-02 MED ORDER — ONDANSETRON 4 MG PO TBDP: 4.0000 mg | ORAL_TABLET | Freq: Four times a day (QID) | ORAL | Status: DC | PRN

## 2016-11-02 MED ORDER — KETOROLAC TROMETHAMINE 30 MG/ML IJ SOLN
30.0000 mg | Freq: Four times a day (QID) | INTRAMUSCULAR | Status: AC | PRN
Start: 2016-11-02 — End: 2016-11-03

## 2016-11-02 MED ORDER — LIDOCAINE HCL (PF) 1 % IJ SOLN
30.0000 mL | INTRAMUSCULAR | Status: DC | PRN
Start: 2016-11-02 — End: 2016-11-02
  Filled 2016-11-02: qty 30

## 2016-11-02 MED ORDER — OXYCODONE HCL 5 MG PO TABS: 5.0000 mg | ORAL_TABLET | Freq: Four times a day (QID) | ORAL | Status: DC | PRN

## 2016-11-02 MED ORDER — SODIUM CHLORIDE 0.9 % IJ SOLN
INTRAMUSCULAR | Status: DC | PRN
  Administered 2016-11-02: 50 mL via INTRAVENOUS

## 2016-11-02 MED ORDER — LACTATED RINGERS IV BOLUS (SEPSIS)
1000.0000 mL | Freq: Once | INTRAVENOUS | Status: AC
  Administered 2016-11-02: 1000 mL via INTRAVENOUS

## 2016-11-02 MED ORDER — DIPHENHYDRAMINE HCL 50 MG/ML IJ SOLN: 12.5000 mg | INTRAMUSCULAR | Status: DC | PRN

## 2016-11-02 MED ORDER — SODIUM CHLORIDE 0.9% FLUSH: 3.0000 mL | INTRAVENOUS | Status: DC | PRN

## 2016-11-02 MED ORDER — FENTANYL CITRATE (PF) 100 MCG/2ML IJ SOLN
INTRAMUSCULAR | Status: AC
  Filled 2016-11-02: qty 2

## 2016-11-02 MED ORDER — PHENYLEPHRINE 40 MCG/ML (10ML) SYRINGE FOR IV PUSH (FOR BLOOD PRESSURE SUPPORT)
PREFILLED_SYRINGE | INTRAVENOUS | Status: DC | PRN
  Administered 2016-11-02 (×3): 200 ug via INTRAVENOUS
  Administered 2016-11-02: 100 ug via INTRAVENOUS

## 2016-11-02 MED ORDER — NALOXONE HCL 0.4 MG/ML IJ SOLN: 0.4000 mg | INTRAMUSCULAR | Status: DC | PRN

## 2016-11-02 MED ORDER — OXYTOCIN 40 UNITS IN LACTATED RINGERS INFUSION - SIMPLE MED
INTRAVENOUS | Status: AC
  Filled 2016-11-02: qty 1000

## 2016-11-02 MED ORDER — ONDANSETRON HCL 4 MG/2ML IJ SOLN
INTRAMUSCULAR | Status: AC
  Filled 2016-11-02: qty 2

## 2016-11-02 MED ORDER — OXYTOCIN 40 UNITS IN LACTATED RINGERS INFUSION - SIMPLE MED
2.5000 [IU]/h | INTRAVENOUS | Status: AC
  Administered 2016-11-02: 2.5 [IU]/h via INTRAVENOUS
  Filled 2016-11-02: qty 1000

## 2016-11-02 MED ORDER — MEPERIDINE HCL 25 MG/ML IJ SOLN: 6.2500 mg | INTRAMUSCULAR | Status: DC | PRN

## 2016-11-02 MED ORDER — FENTANYL CITRATE (PF) 100 MCG/2ML IJ SOLN
INTRAMUSCULAR | Status: DC | PRN
  Administered 2016-11-02: 15 ug via INTRATHECAL

## 2016-11-02 MED ORDER — OXYCODONE HCL 5 MG PO TABS
10.0000 mg | ORAL_TABLET | ORAL | Status: DC | PRN
  Administered 2016-11-03 – 2016-11-04 (×2): 10 mg via ORAL
  Filled 2016-11-02 (×2): qty 1
  Filled 2016-11-02: qty 2
  Filled 2016-11-02: qty 1
  Filled 2016-11-02 (×4): qty 2

## 2016-11-02 MED ORDER — LACTATED RINGERS IV SOLN
INTRAVENOUS | Status: DC
  Administered 2016-11-02: 10:00:00 via INTRAVENOUS

## 2016-11-02 MED ORDER — LACTATED RINGERS IV SOLN: 500.0000 mL | INTRAVENOUS | Status: DC | PRN

## 2016-11-02 MED ORDER — BUPIVACAINE LIPOSOME 1.3 % IJ SUSP
20.0000 mL | Freq: Once | INTRAMUSCULAR | Status: DC
  Filled 2016-11-02 (×4): qty 20

## 2016-11-02 MED ORDER — IBUPROFEN 600 MG PO TABS
600.0000 mg | ORAL_TABLET | Freq: Four times a day (QID) | ORAL | Status: DC
  Administered 2016-11-03 – 2016-11-06 (×13): 600 mg via ORAL
  Filled 2016-11-02 (×13): qty 1

## 2016-11-02 SURGICAL SUPPLY — 27 items
BARRIER ADHS 3X4 INTERCEED (GAUZE/BANDAGES/DRESSINGS) ×3 IMPLANT
BLADE CLIPPER SURG (BLADE) ×3 IMPLANT
CANISTER SUCT 3000ML (MISCELLANEOUS) ×3 IMPLANT
CATH KIT ON-Q SILVERSOAK 5IN (CATHETERS) IMPLANT
CHLORAPREP W/TINT 26ML (MISCELLANEOUS) ×6 IMPLANT
DRSG TELFA 3X8 NADH (GAUZE/BANDAGES/DRESSINGS) ×3 IMPLANT
ELECT CAUTERY BLADE 6.4 (BLADE) ×3 IMPLANT
ELECT REM PT RETURN 9FT ADLT (ELECTROSURGICAL) ×3
ELECTRODE REM PT RTRN 9FT ADLT (ELECTROSURGICAL) ×1 IMPLANT
GAUZE SPONGE 4X4 12PLY STRL (GAUZE/BANDAGES/DRESSINGS) ×3 IMPLANT
GLOVE BIO SURGEON STRL SZ8 (GLOVE) ×3 IMPLANT
GOWN STRL REUS W/ TWL LRG LVL3 (GOWN DISPOSABLE) ×2 IMPLANT
GOWN STRL REUS W/ TWL XL LVL3 (GOWN DISPOSABLE) ×1 IMPLANT
GOWN STRL REUS W/TWL LRG LVL3 (GOWN DISPOSABLE) ×4
GOWN STRL REUS W/TWL XL LVL3 (GOWN DISPOSABLE) ×2
NS IRRIG 1000ML POUR BTL (IV SOLUTION) ×6 IMPLANT
PACK C SECTION AR (MISCELLANEOUS) ×3 IMPLANT
PAD OB MATERNITY 4.3X12.25 (PERSONAL CARE ITEMS) ×3 IMPLANT
PAD PREP 24X41 OB/GYN DISP (PERSONAL CARE ITEMS) ×3 IMPLANT
SPONGE LAP 18X18 5 PK (GAUZE/BANDAGES/DRESSINGS) ×3 IMPLANT
STAPLER INSORB 30 2030 C-SECTI (MISCELLANEOUS) ×3 IMPLANT
STRAP SAFETY BODY (MISCELLANEOUS) ×3 IMPLANT
SUCT VACUUM KIWI BELL (SUCTIONS) ×3 IMPLANT
SUT CHROMIC 1 CTX 36 (SUTURE) ×9 IMPLANT
SUT CHROMIC 3 0 SH 27 (SUTURE) ×9 IMPLANT
SUT PLAIN GUT 0 (SUTURE) ×6 IMPLANT
SUT VIC AB 0 CT1 36 (SUTURE) ×6 IMPLANT

## 2016-11-02 NOTE — Op Note (Signed)
NAME:  Sabrina Freeman, Sabrina Freeman                 ACCOUNT NO.:  [id number]  MEDICAL RECORD NO.:  [id number]  LOCATION:  ED                           FACILITY:  [facility]  PHYSICIAN:  Jennell Cornerhomas Jhaden Pizzuto, MDDATE OF BIRTH:  05/22/1981  DATE OF PROCEDURE:11/02/2016 DATE OF DISCHARGE:                              OPERATIVE REPORT   PREOPERATIVE DIAGNOSIS: 1. 38 plus 4 weeks estimated gestational age. 2. Non-reassuring fetal monitoring. 3. Prior cesarean section.  POSTOPERATIVE DIAGNOSIS: 1. 38 plus 4 weeks estimated gestational age. 2. Non-reassuring fetal monitoring. 3. Prior cesarean section. 4. Meconium staining.  PROCEDURE:  Repeat low transverse cesarean section.  ANESTHESIA:  Spinal.  SURGEON:  Jennell Cornerhomas Persais Ethridge, MD  FIRST ASSISTANT:  Brame, scrub tech.  INDICATION:  A 36 year old gravida 2, para 1 patient admitted to Labor and Delivery with active contractions and bloody show.  Fetal monitoring showed recurrent fetal bradycardic episodes.  The patient had a prior cesarean section, was scheduled for a cesarean section.  The patient declines bilateral tubal ligation.  DESCRIPTION OF PROCEDURE:  After adequate spinal anesthesia, the patient is placed in dorsal supine position.  Hip rolled on the right side.  The patient was prepped and draped in normal sterile fashion.  Time-out was performed.  The patient did receive 3 g IV cefoxitin prior to commencement of the case for surgical prophylaxis.  A Pfannenstiel incision was made inferior to the prior cesarean section scar.  Sharp dissection was used to identify the fascia.  Fascia was opened in the midline and opened in a transverse fashion.  Superior aspect of fascia was grasped with Kocher clamps and the recti muscles were dissected free.  Inferior aspect of the fascia was grasped with Kocher clamps and the pyramidalis muscle was dissected free.  Entry into the peritoneal cavity was accomplished sharply.  The vesicular uterine peritoneal  fold was identified and the bladder was reflected inferiorly.  A low transverse uterine incision was made upon entry into the amniotic cavity.  Thick meconium was noted.  Copious amount of fluid was noted. The fetal head was brought to the incision and kiwi vacuum was applied to the occiput.  One gentle pull allowed for delivery of the head, the vacuum was removed.  Shoulders and body were then delivered, a floppy large female.  The cord was doubly clamped and the infant was passed to nursery staff who assigned Apgar scores of 3 and 8.  Cord gas was obtained and pending at the time of dictation.  Time of delivery, 10:43 on November 02, 2016.  The placenta was then manually removed and the uterus was externalized and the endometrial cavity was wiped clean with a laparotomy tape.  Ring forceps was used to open the cervix.  Uterine incision was then closed with 1 chromic suture in a running locking fashion.  Good approximation of edges and good hemostasis was noted. The posterior cul-de-sac was irrigated and suctioned and the uterus was placed back into the abdominal cavity and the paracolic gutters were wiped clean with a laparotomy tape.  Of note, the uterus was somewhat atonic during the repair and the patient did require 0.2 mg intramuscular Methergine.  The uterine incision again appeared  hemostatic.  Interceed was placed over the uterine incision with Interceed in a T-shaped fashion.  The fascia was then closed with 0 Vicryl suture in a running nonlocking fashion.  Two separate Vicryl sutures were used.  The fascial edge was then injected with a solution comprising of 20 mL of 1.3% bupivacaine with 30 mL 0.5% Marcaine and 50 mL of normal saline.  A 60 mL was used to inject the fascial edge on both sides.  The subcutaneous tissues were irrigated and bovied and given the approximate depth of 4 cm for subcutaneous tissues, a 3-0 chromic suture was used to close dead space.  The skin  was reapproximated with Insorb staples and the skin was then injected with additional 20 mL of the bupivacaine and Marcaine solution.  Good hemostasis was noted.  There were no complications.  ESTIMATED BLOOD LOSS:  650 mL.  INTRAOPERATIVE FLUIDS:  900 mL.  URINE OUTPUT:  15 mL.  The patient tolerated the procedure well and was taken to recovery room in good condition.          ______________________________ Jennell Corner, MD     TS/MEDQ  D:  11/02/2016  T:  11/02/2016  Job:  [id number]

## 2016-11-02 NOTE — H&P (Signed)
Sabrina CrankerSara N Freeman is a 36 y.o. femaleG2P1 prior c/s for fetal intolerance . Pt  presenting for uterine ctx and bloody show . 38+4 weeks . Several fetal bradycardic episodes. OB History    Gravida Para Term Preterm AB Living   2 1 1  0 0 1   SAB TAB Ectopic Multiple Live Births   0 0 0 0 1      Obstetric Comments   Gestational HTN without proteinuria and tx without meds     Past Medical History:  Diagnosis Date  . External hemorrhoids   . Heart murmur   . Indication for care in labor or delivery   . Leukopenia    Past Surgical History:  Procedure Laterality Date  . CESAREAN SECTION N/A 02/05/2015   Procedure: CESAREAN SECTION;  Surgeon: Suzy Bouchardhomas J Chrisopher Pustejovsky, MD;  Location: [facility] ORS;  Service: Obstetrics;  Laterality: N/A;  . OOPHORECTOMY Left 02/05/2015   Procedure: OOPHORECTOMY;  Surgeon: Suzy Bouchardhomas J Ajmal Kathan, MD;  Location: [facility] ORS;  Service: Obstetrics;  Laterality: Left;  . WISDOM TOOTH EXTRACTION     Family History: family history includes Cancer in her maternal grandfather and maternal grandmother; Heart failure in her paternal grandfather; Hernia in her paternal grandfather. Social History:  reports that she has never smoked. She has never used smokeless tobacco. She reports that she does not drink alcohol or use drugs.     Maternal Diabetes: No Genetic Screening: Normal Maternal Ultrasounds/Referrals: Normal Fetal Ultrasounds or other Referrals:  None Maternal Substance Abuse:  No Significant Maternal Medications:  None Significant Maternal Lab Results:  None Other Comments:  None htn , no meds  ROS History   Blood pressure (!) 118/55, pulse (!) 102, temperature 98.9 F (37.2 C), temperature source Oral, resp. rate 18, height 5\' 8"  (1.727 m), weight 249 lb (112.9 kg), unknown if currently breastfeeding. Exam  Lungs CTA  cv RRR Physical Exam  Prenatal labs: ABO, Rh:  o+ Antibody:  neg Rubella:  IMM, varicella neg RPR:   NR HBsAg:   neg HIV:   neg GBS:    +  Assessment/Plan: Term gestation with non reassuring fetal monitoring  Proceed to repeat LTCS , urgent   Burhanuddin Kohlmann 11/02/2016, 9:44 AM

## 2016-11-02 NOTE — Brief Op Note (Signed)
11/02/2016 Date of delivery 11/02/16 at 1043  PATIENT:  Sabrina Freeman  36 y.o. female  PRE-OPERATIVE DIAGNOSIS: non reassuring fetal monitoring  Prior C/S  POST-OPERATIVE DIAGNOSIS:  Same  Meconium staining  PROCEDURE:  Procedure(s): CESAREAN SECTION (N/A) LTCS SURGEON:  Surgeon(s) and Role:    Suzy Bouchard* Thomas J Schermerhorn, MD - Primary  PHYSICIAN ASSISTANT: L Brame , scrub tech  ASSISTANTS: none   ANESTHESIA:   spinal  EBL:  Total I/O In: 1000 [I.V.:1000] Out: 665 [Urine:15; Blood:650]  BLOOD ADMINISTERED:none  DRAINS: Urinary Catheter (Foley)   LOCAL MEDICATIONS USED:  MARCAINE   , BUPIVICAINE  and Amount: 80 ml  SPECIMEN:  Source of Specimen:  placenta  DISPOSITION OF SPECIMEN:  PATHOLOGY  COUNTS:  YES  TOURNIQUET:  * No tourniquets in log *  DICTATION: .Other Dictation: Dictation Number verbal  PLAN OF CARE: Admit to inpatient   PATIENT DISPOSITION:  PACU - hemodynamically stable.   Delay start of Pharmacological VTE agent (>24hrs) due to surgical blood loss or risk of bleeding: not applicable

## 2016-11-02 NOTE — Anesthesia Preprocedure Evaluation (Signed)
Anesthesia Evaluation  Patient identified by MRN, date of birth, ID band Patient awake    Reviewed: Allergy & Precautions, H&P , NPO status , Patient's Chart, lab work & pertinent test results, reviewed documented beta blocker date and time   History of Anesthesia Complications Negative for: history of anesthetic complications  Airway Mallampati: II  TM Distance: >3 FB Neck ROM: full    Dental  (+) Implants, Teeth Intact   Pulmonary neg pulmonary ROS,           Cardiovascular Exercise Tolerance: Good (-) hypertension(-) angina(-) CAD, (-) Past MI, (-) Cardiac Stents and (-) CABG (-) dysrhythmias + Valvular Problems/Murmurs      Neuro/Psych negative neurological ROS  negative psych ROS   GI/Hepatic Neg liver ROS, GERD  ,  Endo/Other  negative endocrine ROS  Renal/GU negative Renal ROS  negative genitourinary   Musculoskeletal   Abdominal   Peds  Hematology negative hematology ROS (+)   Anesthesia Other Findings Past Medical History: No date: External hemorrhoids No date: Heart murmur No date: Indication for care in labor or delivery No date: Leukopenia   Reproductive/Obstetrics (+) Pregnancy                             Anesthesia Physical Anesthesia Plan  ASA: II  Anesthesia Plan: Spinal   Post-op Pain Management:    Induction:   Airway Management Planned:   Additional Equipment:   Intra-op Plan:   Post-operative Plan:   Informed Consent: I have reviewed the patients History and Physical, chart, labs and discussed the procedure including the risks, benefits and alternatives for the proposed anesthesia with the patient or authorized representative who has indicated his/her understanding and acceptance.   Dental Advisory Given  Plan Discussed with: Anesthesiologist, CRNA and Surgeon  Anesthesia Plan Comments:         Anesthesia Quick Evaluation

## 2016-11-02 NOTE — Anesthesia Procedure Notes (Signed)
Spinal  Patient location during procedure: OR Start time: 11/02/2016 10:19 AM End time: 11/02/2016 10:23 AM Staffing Anesthesiologist: Martha Clan Resident/CRNA: Johnna Acosta Performed: resident/CRNA  Preanesthetic Checklist Completed: patient identified, site marked, surgical consent, pre-op evaluation, timeout performed, IV checked, risks and benefits discussed and monitors and equipment checked Spinal Block Patient position: sitting Prep: ChloraPrep Patient monitoring: heart rate, continuous pulse ox, blood pressure and cardiac monitor Approach: midline Location: L4-5 Injection technique: single-shot Needle Needle type: Whitacre and Introducer  Needle gauge: 24 G Needle length: 9 cm Assessment Sensory level: T10 Additional Notes Negative paresthesia. Negative blood return. Positive free-flowing CSF. Expiration date of kit checked and confirmed. Patient tolerated procedure well, without complications.

## 2016-11-02 NOTE — Anesthesia Post-op Follow-up Note (Cosign Needed)
Anesthesia QCDR form completed.        

## 2016-11-02 NOTE — Transfer of Care (Signed)
Immediate Anesthesia Transfer of Care Note  Patient: Sabrina Freeman  Procedure(s) Performed: Procedure(s): CESAREAN SECTION (N/A)  Patient Location: PACU  Anesthesia Type:Spinal  Level of Consciousness: awake, alert  and oriented  Airway & Oxygen Therapy: Patient Spontanous Breathing  Post-op Assessment: Report given to RN and Post -op Vital signs reviewed and stable  Post vital signs: Reviewed and stable  Last Vitals:  Vitals:   11/02/16 0901 11/02/16 1147  BP: (!) 118/55 102/79  Pulse: (!) 102 91  Resp: 18 18  Temp: 37.2 C 36.8 C    Last Pain:  Vitals:   11/02/16 1147  TempSrc: Oral  PainSc:       Patients Stated Pain Goal: 0 (11/02/16 0902)  Complications: No apparent anesthesia complications

## 2016-11-02 NOTE — Anesthesia Procedure Notes (Signed)
Date/Time: 11/02/2016 10:20 AM Performed by: Ginger CarneMICHELET, Maevis Mumby Pre-anesthesia Checklist: Patient identified, Emergency Drugs available, Suction available, Patient being monitored and Timeout performed Patient Re-evaluated:Patient Re-evaluated prior to inductionOxygen Delivery Method: Nasal cannula

## 2016-11-02 NOTE — Progress Notes (Signed)
Patient ID: Sabrina Freeman, female   DOB: 11-21-1980, 36 y.o.   MRN: [id number] Pt elects NOT TO UNDERGO BTL

## 2016-11-02 NOTE — OB Triage Note (Signed)
Pt [id number] complains of contractions since last night. Pt states cramping last night and got more intense this morning around 0600. Pt states that around 0800 her contractions got to be every 2-3 minutes apart. Pt rates pain 8/10 with contractions. Pt states + FM and denies leaking of fluid.

## 2016-11-03 ENCOUNTER — Encounter: Payer: Self-pay | Admitting: Obstetrics and Gynecology

## 2016-11-03 LAB — CBC
HEMATOCRIT: 31.6 % — AB (ref 35.0–47.0)
HEMOGLOBIN: 11 g/dL — AB (ref 12.0–16.0)
MCH: 32.3 pg (ref 26.0–34.0)
MCHC: 34.9 g/dL (ref 32.0–36.0)
MCV: 92.5 fL (ref 80.0–100.0)
Platelets: 175 10*3/uL (ref 150–440)
RBC: 3.41 MIL/uL — ABNORMAL LOW (ref 3.80–5.20)
RDW: 13.5 % (ref 11.5–14.5)
WBC: 12.2 10*3/uL — ABNORMAL HIGH (ref 3.6–11.0)

## 2016-11-03 LAB — RPR: RPR: NONREACTIVE

## 2016-11-03 NOTE — Anesthesia Post-op Follow-up Note (Signed)
  Anesthesia Pain Follow-up Note  Patient: Sabrina CrankerSara N Eves  Day #: 1  Date of Follow-up: 11/03/2016 Time: 7:59 AM  Last Vitals:  Vitals:   11/03/16 0300 11/03/16 0730  BP: (!) 149/81 121/77  Pulse: 95 85  Resp: 20 18  Temp: 37.1 C 36.7 C    Level of Consciousness: alert  Pain: none   Side Effects:None  Catheter Site Exam:clean, dry, no drainage     Plan: Continue current therapy of postop epidural at surgeon's request  Abbigael Detlefsen,  Sheran FavaMark R

## 2016-11-03 NOTE — Progress Notes (Signed)
Subjective: Postpartum Day 1: Cesarean Delivery Patient reports feeling well but, has not held baby yet  Objective: Vital signs in last 24 hours: Temp:  [98.1 F (36.7 C)-98.7 F (37.1 C)] 98.1 F (36.7 C) (03/27 0730) Pulse Rate:  [74-95] 85 (03/27 0730) Resp:  [15-20] 18 (03/27 0730) BP: (102-149)/(65-88) 121/77 (03/27 0730) SpO2:  [97 %-100 %] 99 % (03/27 0730)  Physical Exam:  General: A,A&O x2 Heart: S1S2, RRR, No M/R/G. Lungs: CTA bilat, no W/R/R. Lochia: appropriate Uterine Fundus: firm, U-1 Incision: healing well, no significant drainage, no significant erythema, Dressing removed with scant dc. No fresh bleeding noted DVT Evaluation: No evidence of DVT seen on physical exam.   Recent Labs  11/02/16 0932 11/03/16 0435  HGB 13.0 11.0*  HCT 37.9 31.6*    Assessment/Plan: Status post Cesarean section. Doing well postoperatively.  Continue current care.  Sharee Pimplearon W Yitzchak Kothari 11/03/2016, 9:25 AM

## 2016-11-03 NOTE — Progress Notes (Signed)
Dime size blister noted on lower right side of abdomen near surgical incision after removal of original dressing.

## 2016-11-03 NOTE — Anesthesia Postprocedure Evaluation (Signed)
Anesthesia Post Note  Patient: Sabrina Freeman  Procedure(s) Performed: Procedure(s) (LRB): CESAREAN SECTION (N/A)  Patient location during evaluation: [facility] Anesthesia Type: Spinal Level of consciousness: awake, awake and alert and oriented Pain management: pain level controlled Vital Signs Assessment: post-procedure vital signs reviewed and stable Respiratory status: spontaneous breathing, nonlabored ventilation and respiratory function stable Cardiovascular status: stable Postop Assessment: no headache, no backache and epidural receding Anesthetic complications: no     Last Vitals:  Vitals:   11/03/16 0300 11/03/16 0730  BP: (!) 149/81 121/77  Pulse: 95 85  Resp: 20 18  Temp: 37.1 C 36.7 C    Last Pain:  Vitals:   11/03/16 0730  TempSrc: Oral  PainSc:                  Lyn RecordsNoles,  Sunil Hue R

## 2016-11-03 NOTE — Anesthesia Post-op Follow-up Note (Cosign Needed)
  Anesthesia Pain Follow-up Note  Patient: Sabrina Freeman  Day #: 1  Date of Follow-up: 11/03/2016 Time: 7:58 AM  Last Vitals:  Vitals:   11/03/16 0300 11/03/16 0730  BP: (!) 149/81 121/77  Pulse: 95 85  Resp: 20 18  Temp: 37.1 C 36.7 C    Level of Consciousness: alert  Pain: none   Side Effects:None  Catheter Site Exam:clean, dry, no drainage     Plan: Continue current therapy of postop epidural at surgeon's request  Sophia Sperry,  Sheran FavaMark R

## 2016-11-04 NOTE — Progress Notes (Signed)
2 Days Post-Op Procedure(s) (LRB): CESAREAN SECTION (N/A)  Subjective: Patient reports tolerating PO and no problems voiding.   Baby still in nursery . O2 and feeding tube  Objective: I have reviewed patient's vital signs.  General: alert and cooperative Resp: clear to auscultation bilaterally Cardio: regular rate and rhythm, S1, S2 normal, no murmur, click, rub or gallop GI: soft, non-tender; bowel sounds normal; no masses,  no organomegaly incision C/D/I  Assessment: s/p Procedure(s): CESAREAN SECTION (N/A): stable  Plan: continue current plan  LOS: 2 days    SCHERMERHORN,THOMAS 11/04/2016, 9:24 AM

## 2016-11-05 NOTE — Lactation Note (Signed)
This note was copied from a baby's chart. Lactation Consultation Note  Patient Name: Sabrina Rogelia RohrerSara Friedlander FBPZW'CToday's Date: 11/05/2016     Maternal Data Formula Feeding for Exclusion: Yes  Feeding Feeding Type: Formula Nipple Type: Regular Length of feed: 30 min  Consult Status   Gave information related to engorgment. Mother may use ice or cabbage leaves if needed.  Gilman SchmidtCarolyn P Krystena Reitter 11/05/2016, 11:44 AM

## 2016-11-05 NOTE — Progress Notes (Signed)
Subjective: Postpartum Day 3: Cesarean Delivery Patient reports incisional pain, tolerating PO and + flatus.    Objective: Vital signs in last 24 hours: Temp:  [97.6 F (36.4 C)-98.4 F (36.9 C)] 97.9 F (36.6 C) (03/29 0700) Pulse Rate:  [72-84] 72 (03/29 0700) Resp:  [18-20] 18 (03/29 0700) BP: (106-124)/(65-83) 106/65 (03/29 0700) SpO2:  [99 %-100 %] 100 % (03/29 0700)  Physical Exam:  General: alert, cooperative and fatigued Lochia: appropriate Uterine Fundus: firm Incision: healing well, no significant drainage, no dehiscence, no significant erythema DVT Evaluation: No evidence of DVT seen on physical exam.   Recent Labs  11/03/16 0435  HGB 11.0*  HCT 31.6*   Assessment/Plan: Status post Cesarean section. Doing well postoperatively.  Continue current care. Plan for d/c home tomorrow  Christeen DouglasBEASLEY, Harmonee Tozer 11/05/2016, 1:34 PM

## 2016-11-06 ENCOUNTER — Inpatient Hospital Stay: Admission: RE | Admit: 2016-11-06 | Payer: BLUE CROSS/BLUE SHIELD | Source: Ambulatory Visit

## 2016-11-06 MED ORDER — IBUPROFEN 600 MG PO TABS: 600.0000 mg | ORAL_TABLET | Freq: Four times a day (QID) | ORAL | 1 refills | Status: AC | PRN

## 2016-11-06 MED ORDER — OXYCODONE-ACETAMINOPHEN 5-325 MG PO TABS: 1.0000 | ORAL_TABLET | ORAL | 0 refills | Status: AC | PRN

## 2016-11-06 MED ORDER — ESCITALOPRAM OXALATE 10 MG PO TABS: 10.0000 mg | ORAL_TABLET | Freq: Every day | ORAL | 11 refills | Status: AC

## 2016-11-06 NOTE — Discharge Instructions (Signed)
Please call your doctor or return to the ER if you experience any chest pains, shortness of breath, dizziness, visual changes, fever greater than 101, any heavy bleeding (saturating more than 1 pad per hour), large clots, or foul smelling discharge, any worsening abdominal pain and cramping that is not controlled by pain medication, or any signs of postpartum depression. No tampons, enemas, douches, or sexual intercourse for 6 weeks. Also avoid tub baths, hot tubs, or swimming for 6 weeks.   Check your incision daily for any signs of infection such as redness, swelling, warmth, increased pain, pus, or foul smelling drainage

## 2016-11-06 NOTE — Progress Notes (Signed)
Discharge order received from doctor. Reviewed discharge instructions and prescriptions with patient and answered all questions. Follow up appointment instructions given. Patient verbalized understanding. Patient discharged home ( infant in SCN) via wheelchair by nursing/auxillary.    Oswald Hillock, RN

## 2016-11-13 NOTE — Discharge Summary (Signed)
Obstetrical Discharge Summary  Patient Name: Sabrina Freeman DOB: Jan 09, 1981 MRN: [id number]  Date of Admission: 11/02/2016 Date of Delivery: 11/02/2016 Delivered by: Dr. Feliberto Gottron Date of Discharge: 11/06/16  Primary OB: [facility]   LMP:02/05/2016  EDC Estimated Date of Delivery: 11/12/16 Gestational Age at Delivery: [redacted]w[redacted]d   Antepartum complications: history of LTCS, S>D, GHTN, GBS+, desired permanent sterilization Admitting Diagnosis:  Persistent category 2 strip, contractions Secondary Diagnosis: Patient Active Problem List   Diagnosis Date Noted  . Uterine contractions during pregnancy 11/02/2016  . Indication for care in labor or delivery 10/28/2016  . Supervision of high risk pregnancy in third trimester 10/15/2016  . Post-operative state 02/05/2015  . Pregnancy 02/03/2015    Augmentation: none Complications: None Intrapartum complications/course:  Date of Delivery:  Delivered By: Ranae Plumber Delivery Type: repeat cesarean section, low transverse incision Anesthesia: spinal Placenta: expressed Laceration: n/a Episiotomy: none Newborn Data: Live born female  Birth Weight: 10 lb 13.9 oz (4930 g) APGAR: 3, 8  Postpartum Procedures: none  Post partum course:     Patient had an uncomplicated postpartum course.  By time of discharge on POD#4, her pain was controlled on oral pain medications; she had appropriate lochia and was ambulating, voiding without difficulty, tolerating regular diet and passing flatus.   She was deemed stable for discharge to home.    Discharge Physical Exam:   BP 116/76 (BP Location: Left Arm)   Pulse 73   Temp 98.1 F (36.7 C) (Oral)   Resp 18   Ht  (1.727 m)   Wt 112.9 kg (249 lb)   LMP  (Approximate)   SpO2 99%   Breastfeeding? Unknown   BMI 37.86 kg/m   General: NAD CV: RRR Pulm: CTABL, nl effort ABD: s/nd/nt, fundus firm and below the umbilicus Lochia: moderate Incision: c/d/i DVT Evaluation: LE non-ttp, no  evidence of DVT on exam.  Hemoglobin  Date Value Ref Range Status  11/03/2016 11.0 (L) 12.0 - 16.0 g/dL Final   HCT  Date Value Ref Range Status  11/03/2016 31.6 (L) 35.0 - 47.0 % Final     Disposition: stable, discharge to home. Baby Feeding: breastmilk  Baby Disposition: staying in SCN   Rh Immune globulin given: no Rubella vaccine given: n/a Tdap vaccine given in AP or PP setting: AP Flu vaccine given in AP or PP setting: AP  Contraception: BTL  Prenatal Labs:  ABO, Rh:  o+ Antibody:  neg Rubella:  IMM, varicella neg RPR:   NR HBsAg:   neg HIV:   neg GBS:   +   Plan:  Sabrina Freeman was discharged to home in good condition. Follow-up appointment at [facility] in 2 weeks   Discharge Medications: Allergies as of 11/06/2016   No Known Allergies     Medication List    TAKE these medications   aspirin 81 MG tablet Take 81 mg by mouth daily.   escitalopram 10 MG tablet Commonly known as:  LEXAPRO Take 1 tablet (10 mg total) by mouth daily.   ferrous fumarate 325 (106 Fe) MG Tabs tablet Commonly known as:  HEMOCYTE - 106 mg FE Take 1 tablet by mouth.   folic acid 400 MCG tablet Commonly known as:  FOLVITE Take 400 mcg by mouth daily.   ibuprofen 600 MG tablet Commonly known as:  ADVIL,MOTRIN Take 1 tablet (600 mg total) by mouth every 6 (six) hours as needed for mild pain.   oxyCODONE-acetaminophen 5-325 MG tablet Commonly known  as:  PERCOCET/ROXICET Take 1-2 tablets by mouth every 4 (four) hours as needed for moderate pain or severe pain (1: Moderate 2: Severe).   prenatal multivitamin Tabs tablet Take 1 tablet by mouth daily at 12 noon.   Vitamin D (Ergocalciferol) 50000 units Caps capsule Commonly known as:  DRISDOL Take 50,000 Units by mouth every 7 (seven) days.       Follow-up Information    SCHERMERHORN,THOMAS, MD. Schedule an appointment as soon as possible for a visit in 2 week(s).   Specialty:  Obstetrics and  Gynecology Why:  Please call to make your 2 week postpartum follow up appointment for an incision check Contact information: [address] [place] [address] [phone]           Signed: ----- Ranae Plumber, MD Attending Obstetrician and Gynecologist [facility], Department of OB/GYN [facility]

## 2018-03-28 ENCOUNTER — Other Ambulatory Visit: Payer: Self-pay | Admitting: Obstetrics and Gynecology

## 2018-03-28 DIAGNOSIS — Z1231 Encounter for screening mammogram for malignant neoplasm of breast: Secondary | ICD-10-CM

## 2018-05-19 ENCOUNTER — Ambulatory Visit
Admission: RE | Admit: 2018-05-19 | Discharge: 2018-05-19 | Disposition: A | Discharge status: Home / Self Care | Payer: 59 | Source: Ambulatory Visit | Attending: Obstetrics and Gynecology | Admitting: Obstetrics and Gynecology

## 2018-05-19 DIAGNOSIS — Z1231 Encounter for screening mammogram for malignant neoplasm of breast: Secondary | ICD-10-CM | POA: Diagnosis present

## 2018-05-23 ENCOUNTER — Other Ambulatory Visit: Payer: Self-pay | Admitting: Obstetrics and Gynecology

## 2018-05-23 DIAGNOSIS — N6489 Other specified disorders of breast: Secondary | ICD-10-CM

## 2018-05-23 DIAGNOSIS — R928 Other abnormal and inconclusive findings on diagnostic imaging of breast: Secondary | ICD-10-CM

## 2018-06-01 ENCOUNTER — Ambulatory Visit
Admission: RE | Admit: 2018-06-01 | Discharge: 2018-06-01 | Disposition: A | Discharge status: Home / Self Care | Payer: 59 | Source: Ambulatory Visit | Attending: Obstetrics and Gynecology | Admitting: Obstetrics and Gynecology

## 2018-06-01 DIAGNOSIS — R928 Other abnormal and inconclusive findings on diagnostic imaging of breast: Secondary | ICD-10-CM | POA: Diagnosis not present

## 2018-06-01 DIAGNOSIS — N6489 Other specified disorders of breast: Secondary | ICD-10-CM

## 2018-06-03 ENCOUNTER — Other Ambulatory Visit: Payer: 59

## 2018-06-03 ENCOUNTER — Ambulatory Visit: Payer: 59

## 2021-01-29 ENCOUNTER — Other Ambulatory Visit: Payer: Self-pay | Admitting: Obstetrics and Gynecology

## 2021-01-29 DIAGNOSIS — Z1231 Encounter for screening mammogram for malignant neoplasm of breast: Secondary | ICD-10-CM

## 2021-02-24 ENCOUNTER — Other Ambulatory Visit: Payer: Self-pay

## 2021-02-24 ENCOUNTER — Ambulatory Visit
Admission: RE | Admit: 2021-02-24 | Discharge: 2021-02-24 | Disposition: A | Discharge status: Home / Self Care | Payer: Managed Care, Other (non HMO) | Source: Ambulatory Visit | Attending: Obstetrics and Gynecology | Admitting: Obstetrics and Gynecology

## 2021-02-24 DIAGNOSIS — Z1231 Encounter for screening mammogram for malignant neoplasm of breast: Secondary | ICD-10-CM | POA: Insufficient documentation

## 2021-11-23 IMAGING — MG MM DIGITAL SCREENING BILAT W/ TOMO AND CAD
8 series · 8 of 24 positions shown · non-contrast
Comparison: Previous exam(s).

CLINICAL DATA: Screening.

EXAM:
DIGITAL SCREENING BILATERAL MAMMOGRAM WITH TOMOSYNTHESIS AND CAD
TECHNIQUE: Bilateral screening digital craniocaudal and mediolateral oblique
mammograms were obtained. Bilateral screening digital breast
tomosynthesis was performed. The images were evaluated with
computer-aided detection.

[R CC synth-2D]
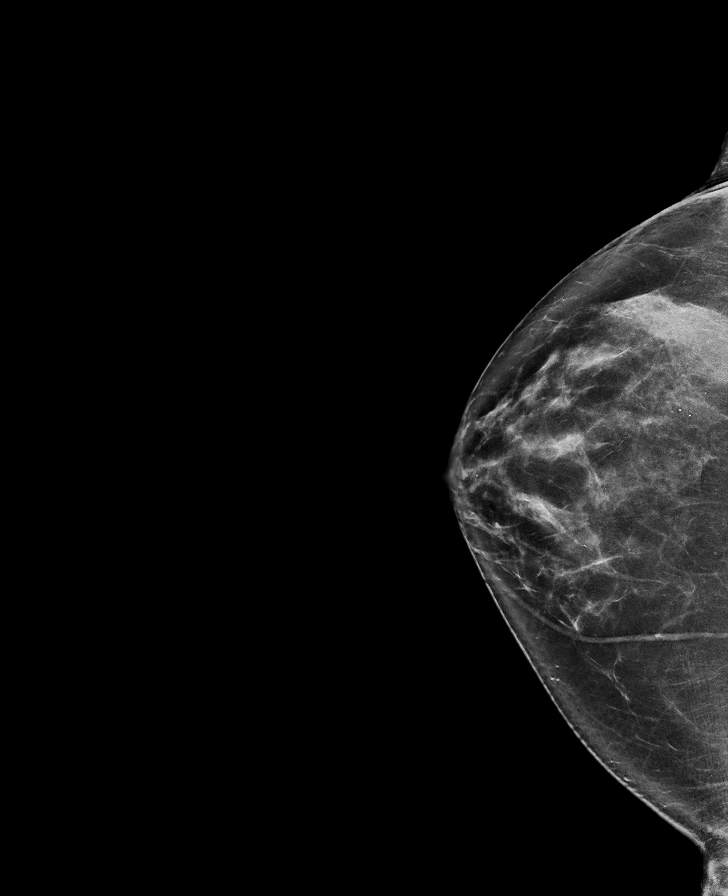

[L MLO synth-2D]
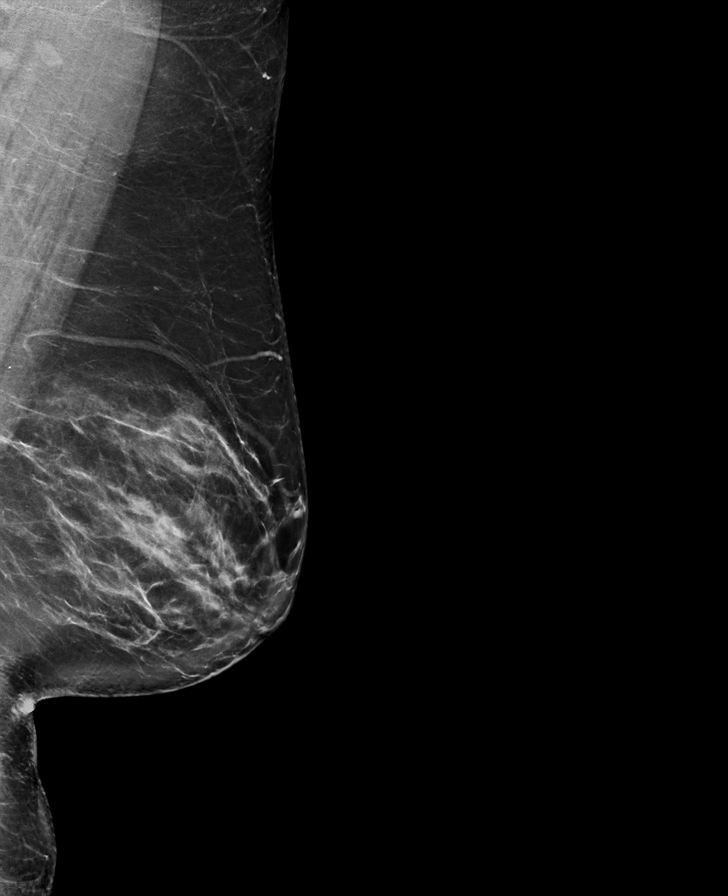

[R MLO synth-2D]
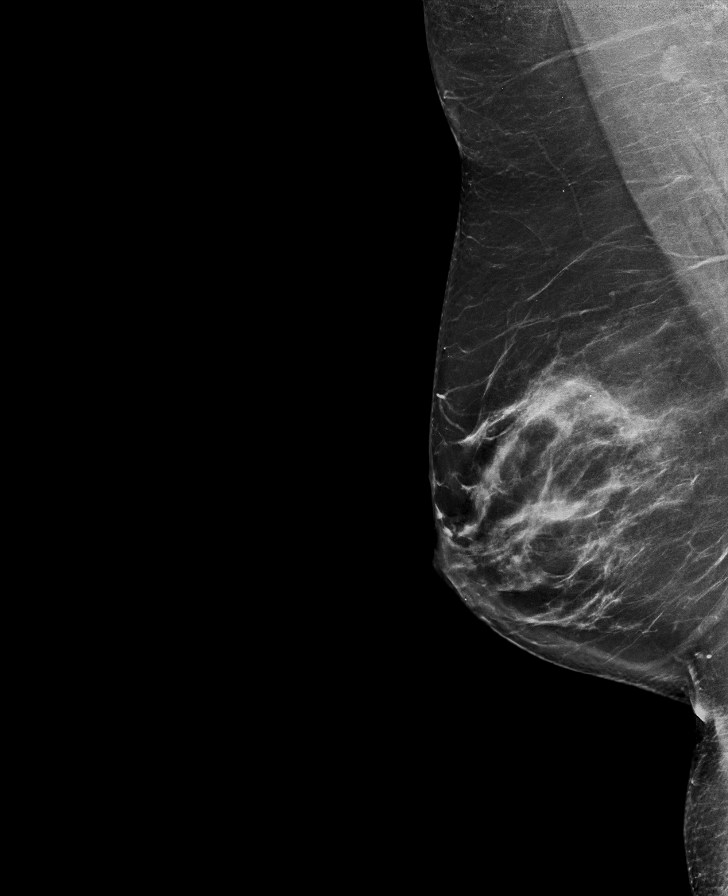

[L CC synth-2D]
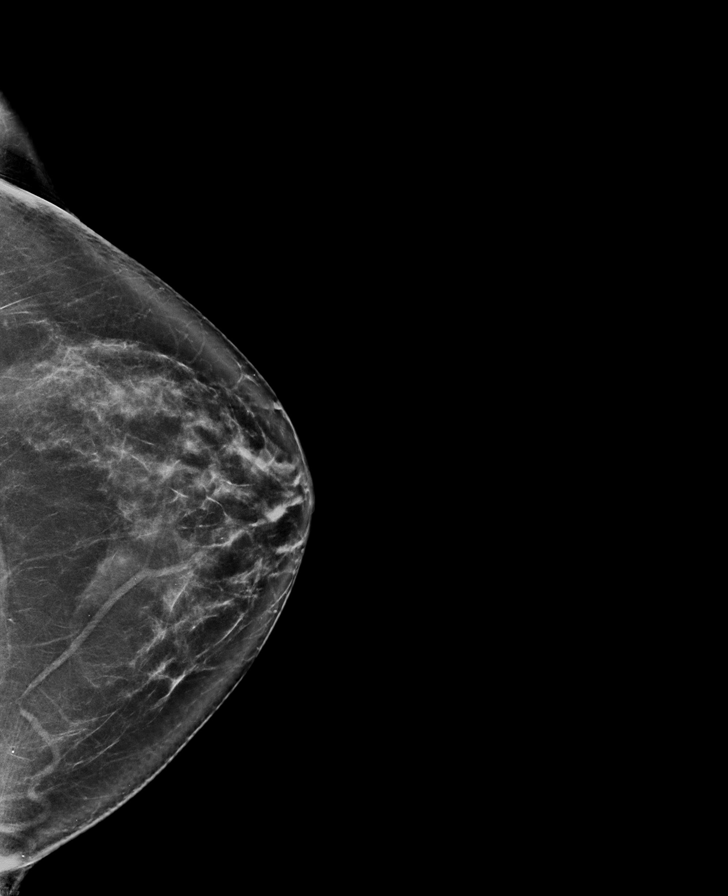

[L MLO tomo · tomo slice 46/91.0]
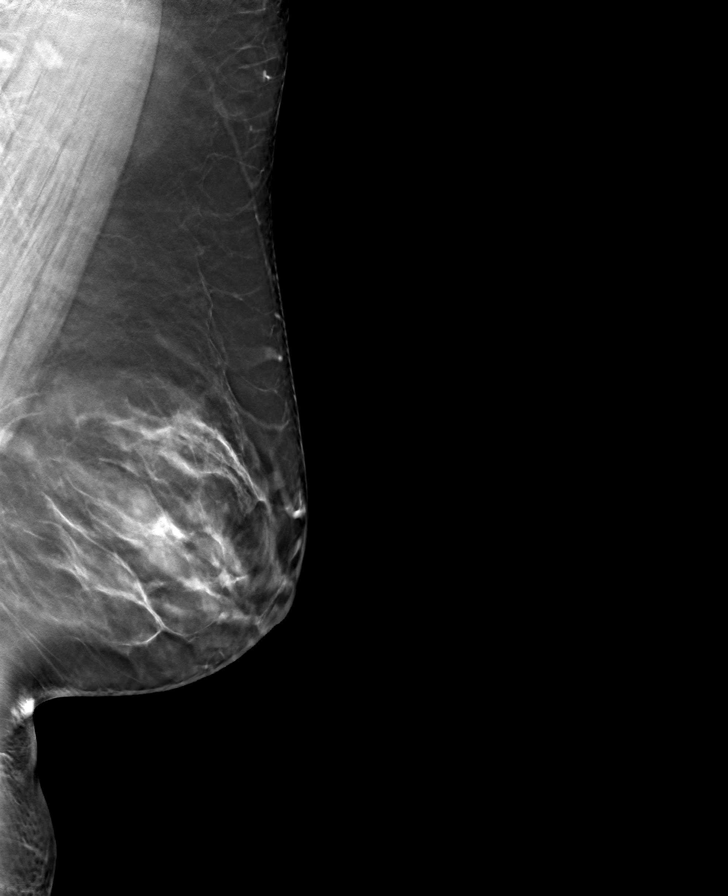

[R CC tomo · tomo slice 43/84.0]
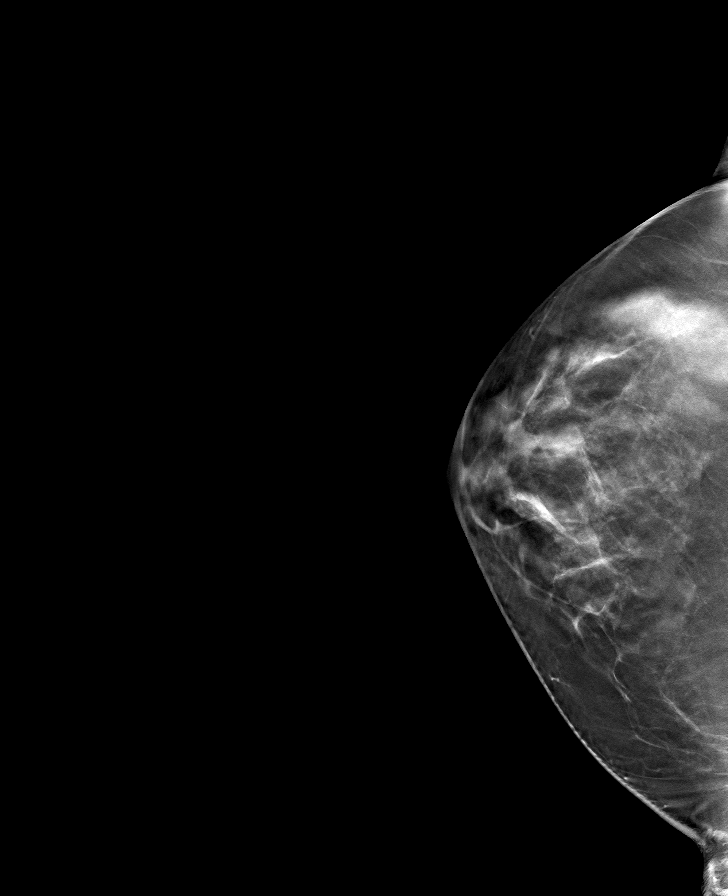

[L CC tomo · tomo slice 42/83.0]
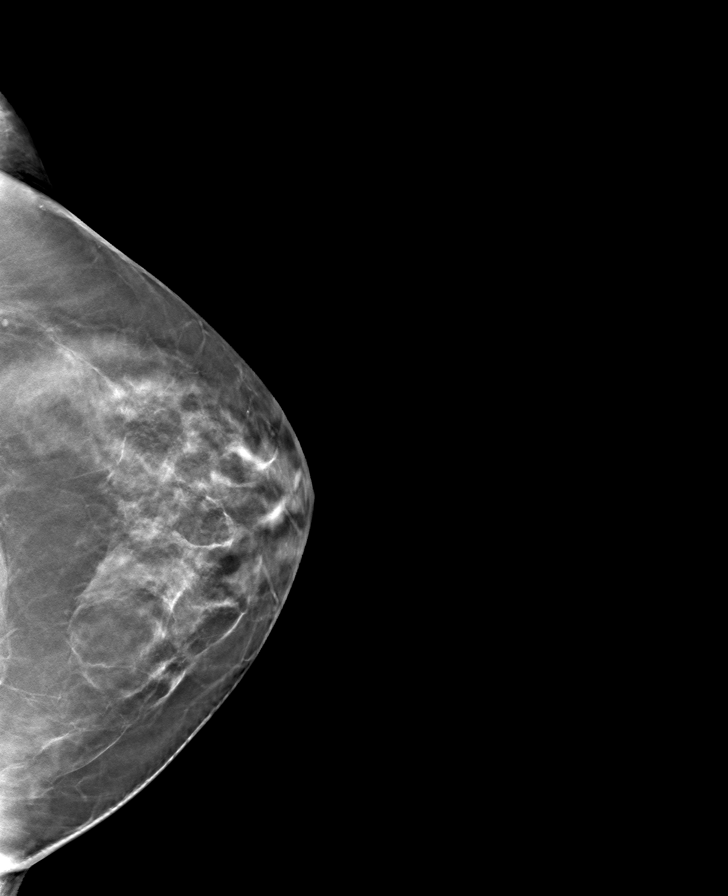

[R MLO tomo · tomo slice 43/86.0]
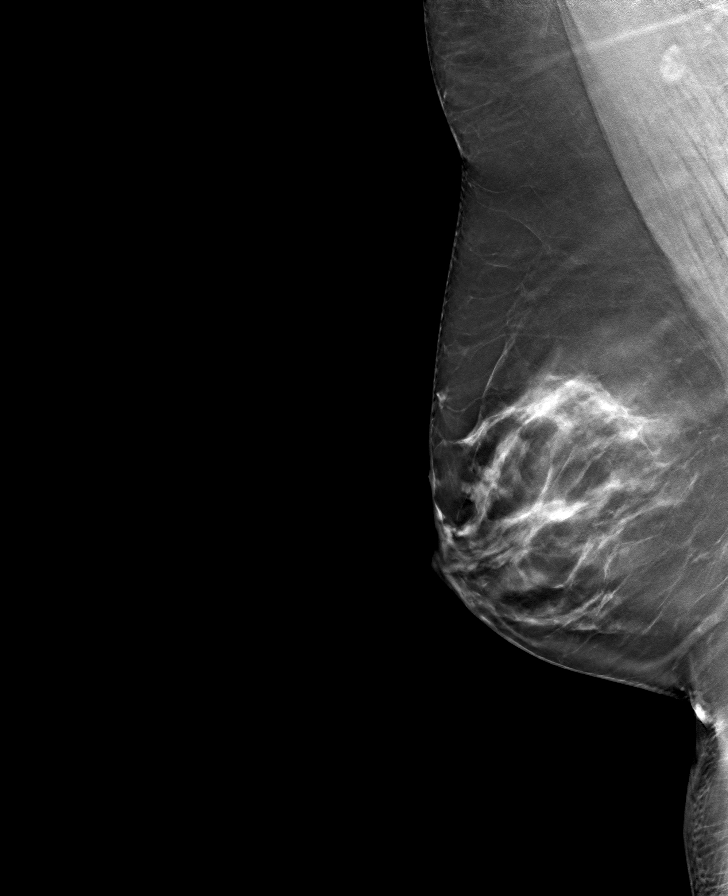

[8 of 24 positions shown; findings below may reference images not displayed]

ACR Breast Density Category c: The breast tissue is heterogeneously
dense, which may obscure small masses.
FINDINGS: There are no findings suspicious for malignancy.
IMPRESSION: No mammographic evidence of malignancy. A result letter of this
screening mammogram will be mailed directly to the patient.

RECOMMENDATION:
Screening mammogram in one year. (Code:Q3-W-BC3)

BI-RADS CATEGORY  1: Negative.

## 2022-01-27 ENCOUNTER — Other Ambulatory Visit: Payer: Self-pay | Admitting: Internal Medicine

## 2022-01-27 DIAGNOSIS — Z1231 Encounter for screening mammogram for malignant neoplasm of breast: Secondary | ICD-10-CM

## 2022-02-26 ENCOUNTER — Ambulatory Visit
Admission: RE | Admit: 2022-02-26 | Discharge: 2022-02-26 | Disposition: A | Discharge status: Home / Self Care | Payer: Managed Care, Other (non HMO) | Source: Ambulatory Visit | Attending: Internal Medicine | Admitting: Internal Medicine

## 2022-02-26 DIAGNOSIS — Z1231 Encounter for screening mammogram for malignant neoplasm of breast: Secondary | ICD-10-CM | POA: Diagnosis present

## 2022-11-17 ENCOUNTER — Other Ambulatory Visit: Payer: Self-pay | Admitting: Internal Medicine

## 2022-11-17 DIAGNOSIS — F411 Generalized anxiety disorder: Secondary | ICD-10-CM

## 2022-11-17 DIAGNOSIS — R519 Headache, unspecified: Secondary | ICD-10-CM

## 2022-11-17 DIAGNOSIS — I672 Cerebral atherosclerosis: Secondary | ICD-10-CM

## 2022-11-25 ENCOUNTER — Ambulatory Visit
Admission: RE | Admit: 2022-11-25 | Discharge: 2022-11-25 | Disposition: A | Discharge status: Home / Self Care | Payer: Managed Care, Other (non HMO) | Source: Ambulatory Visit | Attending: Internal Medicine | Admitting: Internal Medicine

## 2022-11-25 DIAGNOSIS — R519 Headache, unspecified: Secondary | ICD-10-CM

## 2022-12-02 ENCOUNTER — Ambulatory Visit
Admission: RE | Admit: 2022-12-02 | Discharge: 2022-12-02 | Disposition: A | Discharge status: Home / Self Care | Payer: Managed Care, Other (non HMO) | Source: Ambulatory Visit | Attending: Internal Medicine | Admitting: Internal Medicine

## 2022-12-02 DIAGNOSIS — F411 Generalized anxiety disorder: Secondary | ICD-10-CM | POA: Insufficient documentation

## 2022-12-02 DIAGNOSIS — I672 Cerebral atherosclerosis: Secondary | ICD-10-CM | POA: Insufficient documentation

## 2023-01-27 ENCOUNTER — Other Ambulatory Visit: Payer: Self-pay | Admitting: Internal Medicine

## 2023-01-27 DIAGNOSIS — Z1231 Encounter for screening mammogram for malignant neoplasm of breast: Secondary | ICD-10-CM

## 2023-03-01 ENCOUNTER — Ambulatory Visit
Admission: RE | Admit: 2023-03-01 | Discharge: 2023-03-01 | Disposition: A | Discharge status: Home / Self Care | Payer: Managed Care, Other (non HMO) | Source: Ambulatory Visit | Attending: Internal Medicine | Admitting: Internal Medicine

## 2023-03-01 DIAGNOSIS — Z1231 Encounter for screening mammogram for malignant neoplasm of breast: Secondary | ICD-10-CM | POA: Insufficient documentation

## 2023-12-28 ENCOUNTER — Other Ambulatory Visit: Payer: Self-pay | Admitting: Internal Medicine

## 2023-12-28 DIAGNOSIS — Z1231 Encounter for screening mammogram for malignant neoplasm of breast: Secondary | ICD-10-CM

## 2024-03-02 ENCOUNTER — Ambulatory Visit
Admission: RE | Admit: 2024-03-02 | Discharge: 2024-03-02 | Disposition: A | Discharge status: Home / Self Care | Source: Ambulatory Visit | Attending: Internal Medicine | Admitting: Internal Medicine

## 2024-03-02 ENCOUNTER — Encounter

## 2024-03-02 DIAGNOSIS — Z1231 Encounter for screening mammogram for malignant neoplasm of breast: Secondary | ICD-10-CM | POA: Insufficient documentation

## 2024-03-08 ENCOUNTER — Other Ambulatory Visit: Payer: Self-pay | Admitting: Internal Medicine

## 2024-03-08 DIAGNOSIS — R928 Other abnormal and inconclusive findings on diagnostic imaging of breast: Secondary | ICD-10-CM

## 2024-03-09 ENCOUNTER — Ambulatory Visit
Admission: RE | Admit: 2024-03-09 | Discharge: 2024-03-09 | Disposition: A | Discharge status: Home / Self Care | Source: Ambulatory Visit | Attending: Internal Medicine | Admitting: Internal Medicine

## 2024-03-09 ENCOUNTER — Inpatient Hospital Stay
Admission: RE | Admit: 2024-03-09 | Discharge: 2024-03-09 | Discharge status: Home / Self Care | Source: Ambulatory Visit | Attending: Internal Medicine | Admitting: Internal Medicine

## 2024-03-09 DIAGNOSIS — R928 Other abnormal and inconclusive findings on diagnostic imaging of breast: Secondary | ICD-10-CM

## 2024-03-10 ENCOUNTER — Other Ambulatory Visit: Payer: Self-pay | Admitting: Internal Medicine

## 2024-03-10 DIAGNOSIS — R928 Other abnormal and inconclusive findings on diagnostic imaging of breast: Secondary | ICD-10-CM

## 2024-03-16 ENCOUNTER — Ambulatory Visit
Admission: RE | Admit: 2024-03-16 | Discharge: 2024-03-16 | Disposition: A | Discharge status: Home / Self Care | Source: Ambulatory Visit | Attending: Internal Medicine | Admitting: Internal Medicine

## 2024-03-16 DIAGNOSIS — R928 Other abnormal and inconclusive findings on diagnostic imaging of breast: Secondary | ICD-10-CM

## 2024-03-16 HISTORY — PX: BREAST BIOPSY: SHX20

## 2024-03-16 MED ORDER — LIDOCAINE 1 % OPTIME INJ - NO CHARGE
5.0000 mL | Freq: Once | INTRAMUSCULAR | Status: AC
  Administered 2024-03-16: 5 mL
  Filled 2024-03-16: qty 6

## 2024-03-16 MED ORDER — LIDOCAINE-EPINEPHRINE 1 %-1:100000 IJ SOLN
20.0000 mL | Freq: Once | INTRAMUSCULAR | Status: AC
  Administered 2024-03-16: 20 mL
  Filled 2024-03-16: qty 20
